# Patient Record
Sex: Female | Born: 1960
Health system: Southern US, Community
[De-identification: ages and names within clinical notes are randomized; demographics above are authoritative.]

## PROBLEM LIST (undated history)

## (undated) DIAGNOSIS — N939 Abnormal uterine and vaginal bleeding, unspecified: Secondary | ICD-10-CM

## (undated) DIAGNOSIS — R011 Cardiac murmur, unspecified: Secondary | ICD-10-CM

## (undated) DIAGNOSIS — E785 Hyperlipidemia, unspecified: Secondary | ICD-10-CM

## (undated) DIAGNOSIS — E079 Disorder of thyroid, unspecified: Secondary | ICD-10-CM

## (undated) DIAGNOSIS — I10 Essential (primary) hypertension: Secondary | ICD-10-CM

## (undated) HISTORY — DX: Hyperlipidemia, unspecified: E78.5

## (undated) HISTORY — PX: BREAST SURGERY: SHX581

## (undated) HISTORY — DX: Abnormal uterine and vaginal bleeding, unspecified: N93.9

## (undated) HISTORY — DX: Cardiac murmur, unspecified: R01.1

---

## 2003-02-09 ENCOUNTER — Inpatient Hospital Stay (HOSPITAL_COMMUNITY): Admission: AD | Admit: 2003-02-09 | Discharge: 2003-02-09 | Payer: Self-pay | Admitting: Obstetrics and Gynecology

## 2003-02-10 ENCOUNTER — Encounter: Payer: Self-pay | Admitting: Obstetrics and Gynecology

## 2003-02-11 ENCOUNTER — Encounter: Payer: Self-pay | Admitting: Obstetrics and Gynecology

## 2003-02-11 ENCOUNTER — Inpatient Hospital Stay (HOSPITAL_COMMUNITY): Admission: AD | Admit: 2003-02-11 | Discharge: 2003-02-11 | Payer: Self-pay | Admitting: Obstetrics and Gynecology

## 2003-02-18 ENCOUNTER — Inpatient Hospital Stay (HOSPITAL_COMMUNITY): Admission: AD | Admit: 2003-02-18 | Discharge: 2003-02-18 | Payer: Self-pay | Admitting: Obstetrics and Gynecology

## 2003-02-25 ENCOUNTER — Inpatient Hospital Stay (HOSPITAL_COMMUNITY): Admission: AD | Admit: 2003-02-25 | Discharge: 2003-02-25 | Payer: Self-pay | Admitting: Obstetrics and Gynecology

## 2003-03-03 ENCOUNTER — Encounter: Admission: RE | Admit: 2003-03-03 | Discharge: 2003-03-03 | Payer: Self-pay | Admitting: Obstetrics and Gynecology

## 2003-05-02 ENCOUNTER — Emergency Department (HOSPITAL_COMMUNITY): Admission: EM | Admit: 2003-05-02 | Discharge: 2003-05-03 | Payer: Self-pay | Admitting: Emergency Medicine

## 2003-05-08 ENCOUNTER — Emergency Department (HOSPITAL_COMMUNITY): Admission: EM | Admit: 2003-05-08 | Discharge: 2003-05-08 | Payer: Self-pay | Admitting: Emergency Medicine

## 2003-05-08 ENCOUNTER — Encounter: Payer: Self-pay | Admitting: Emergency Medicine

## 2004-08-19 ENCOUNTER — Emergency Department (HOSPITAL_COMMUNITY): Admission: EM | Admit: 2004-08-19 | Discharge: 2004-08-19 | Payer: Self-pay | Admitting: Emergency Medicine

## 2004-11-16 ENCOUNTER — Other Ambulatory Visit: Admission: RE | Admit: 2004-11-16 | Discharge: 2004-11-16 | Payer: Self-pay | Admitting: Obstetrics and Gynecology

## 2004-12-08 ENCOUNTER — Inpatient Hospital Stay (HOSPITAL_COMMUNITY): Admission: AD | Admit: 2004-12-08 | Discharge: 2004-12-08 | Payer: Self-pay | Admitting: Obstetrics and Gynecology

## 2005-01-02 ENCOUNTER — Ambulatory Visit (HOSPITAL_COMMUNITY): Admission: RE | Admit: 2005-01-02 | Discharge: 2005-01-02 | Payer: Self-pay | Admitting: Obstetrics and Gynecology

## 2005-09-27 ENCOUNTER — Emergency Department (HOSPITAL_COMMUNITY): Admission: EM | Admit: 2005-09-27 | Discharge: 2005-09-28 | Payer: Self-pay | Admitting: Emergency Medicine

## 2009-11-03 ENCOUNTER — Encounter: Admission: RE | Admit: 2009-11-03 | Discharge: 2009-11-03 | Payer: Self-pay | Admitting: Otolaryngology

## 2010-05-25 ENCOUNTER — Encounter: Admission: RE | Admit: 2010-05-25 | Discharge: 2010-05-25 | Payer: Self-pay | Admitting: Internal Medicine

## 2012-04-23 ENCOUNTER — Other Ambulatory Visit: Payer: Self-pay | Admitting: Physician Assistant

## 2012-05-09 ENCOUNTER — Other Ambulatory Visit: Payer: Self-pay | Admitting: *Deleted

## 2012-05-09 MED ORDER — LISINOPRIL-HYDROCHLOROTHIAZIDE 10-12.5 MG PO TABS: 1.0000 | ORAL_TABLET | Freq: Every day | ORAL | Status: DC

## 2012-08-16 ENCOUNTER — Encounter (HOSPITAL_COMMUNITY): Payer: Self-pay

## 2012-08-16 ENCOUNTER — Emergency Department (HOSPITAL_COMMUNITY)
Admission: EM | Admit: 2012-08-16 | Discharge: 2012-08-16 | Disposition: A | Discharge status: Home / Self Care | Payer: 59 | Attending: Emergency Medicine | Admitting: Emergency Medicine

## 2012-08-16 DIAGNOSIS — I10 Essential (primary) hypertension: Secondary | ICD-10-CM

## 2012-08-16 DIAGNOSIS — R51 Headache: Secondary | ICD-10-CM

## 2012-08-16 DIAGNOSIS — R110 Nausea: Secondary | ICD-10-CM | POA: Insufficient documentation

## 2012-08-16 DIAGNOSIS — E079 Disorder of thyroid, unspecified: Secondary | ICD-10-CM | POA: Insufficient documentation

## 2012-08-16 DIAGNOSIS — Z79899 Other long term (current) drug therapy: Secondary | ICD-10-CM | POA: Insufficient documentation

## 2012-08-16 HISTORY — DX: Disorder of thyroid, unspecified: E07.9

## 2012-08-16 HISTORY — DX: Essential (primary) hypertension: I10

## 2012-08-16 MED ORDER — ONDANSETRON 4 MG PO TBDP
4.0000 mg | ORAL_TABLET | Freq: Once | ORAL | Status: AC
  Administered 2012-08-16: 4 mg via ORAL
  Filled 2012-08-16: qty 1

## 2012-08-16 MED ORDER — DIPHENHYDRAMINE HCL 50 MG/ML IJ SOLN: 25.0000 mg | Freq: Once | INTRAMUSCULAR | Status: DC

## 2012-08-16 MED ORDER — DIPHENHYDRAMINE HCL 25 MG PO CAPS
ORAL_CAPSULE | ORAL | Status: AC
  Administered 2012-08-16: 25 mg
  Filled 2012-08-16: qty 1

## 2012-08-16 MED ORDER — OXYCODONE-ACETAMINOPHEN 5-325 MG PO TABS
2.0000 | ORAL_TABLET | Freq: Once | ORAL | Status: AC
  Administered 2012-08-16: 2 via ORAL
  Filled 2012-08-16: qty 2

## 2012-08-16 MED ORDER — DIPHENHYDRAMINE HCL 25 MG PO CAPS
25.0000 mg | ORAL_CAPSULE | Freq: Once | ORAL | Status: AC
  Administered 2012-08-16: 25 mg via ORAL

## 2012-08-16 NOTE — ED Provider Notes (Signed)
History     CSN: [id number]  Arrival date & time 08/16/12  1844   First MD Initiated Contact with Patient 08/16/12 2045      Chief Complaint  Patient presents with  . Headache  . Hypertension  . Nausea    (Consider location/radiation/quality/duration/timing/severity/associated sxs/prior treatment) HPI Comments: Stacey Mason 51 y.o. female   The chief complaint is: Patient presents with:   Headache   Hypertension   Nausea    51 y/o female presents with cc of HA and HTN.c/o throbbing HA BL parietotemporal.  8/10. Gradually worsening throughout day. Pressure like . No hx migraines.  No treatments tried.  BP 180/ 110 at home.  Denies light headedness, weakness, visual disturbances, scotomata. Denies dental pain, sinus pressure or upper respiratory infection symptoms. Denies unilateral weakness, facial asymmetry, difficulty with speech, change in gait, or vertigo. Patient has not taken any meds for treatment.      The history is provided by the patient and a relative. No language interpreter was used.    Past Medical History  Diagnosis Date  . Hypertension   . Thyroid disease     Past Surgical History  Procedure Date  . Breast surgery     No family history on file.  History  Substance Use Topics  . Smoking status: Never Smoker   . Smokeless tobacco: Not on file  . Alcohol Use: No    OB History    Grav Para Term Preterm Abortions TAB SAB Ect Mult Living                  Review of Systems  Constitutional: Negative.   HENT: Negative for congestion, trouble swallowing, neck pain, neck stiffness, dental problem, voice change and sinus pressure.   Eyes: Negative.   Respiratory: Negative.   Cardiovascular: Negative.   Gastrointestinal: Negative.   Genitourinary: Negative.   Musculoskeletal: Negative.   Skin: Negative.   Neurological: Positive for headaches. Negative for dizziness, tremors, seizures, syncope, facial asymmetry, speech difficulty,  weakness, light-headedness and numbness.  Psychiatric/Behavioral: Negative.     Allergies  Review of patient's allergies indicates no known allergies.  Home Medications   Current Outpatient Rx  Name  Route  Sig  Dispense  Refill  . LEVOTHYROXINE SODIUM 75 MCG PO TABS   Oral   Take 75 mcg by mouth daily.         Marland Kitchen LISINOPRIL-HYDROCHLOROTHIAZIDE 10-12.5 MG PO TABS   Oral   Take 1 tablet by mouth daily.   90 tablet   0     NEEDS OFFICE VISIT     BP 150/87  Pulse 82  Temp 98.6 F (37 C) (Oral)  Resp 15  Ht 5\' 4"  (1.626 m)  Wt 183 lb (83.008 kg)  BMI 31.41 kg/m2  SpO2 99%  Physical Exam  Nursing note and vitals reviewed. Constitutional: She is oriented to person, place, and time. She appears well-developed and well-nourished. No distress.  HENT:  Head: Normocephalic and atraumatic.  Eyes: Conjunctivae normal and EOM are normal. Pupils are equal, round, and reactive to light. No scleral icterus.  Neck: Normal range of motion.  Cardiovascular: Normal rate, regular rhythm and normal heart sounds.  Exam reveals no gallop and no friction rub.   No murmur heard. Pulmonary/Chest: Effort normal and breath sounds normal. No respiratory distress.  Abdominal: Soft. Bowel sounds are normal. She exhibits no distension and no mass. There is no tenderness. There is no guarding.  Neurological: She is alert and oriented  to person, place, and time. She has normal reflexes. No cranial nerve deficit. Coordination normal.  Skin: Skin is warm and dry. She is not diaphoretic.  Psychiatric: She has a normal mood and affect. Her behavior is normal. Judgment and thought content normal.    ED Course  Procedures (including critical care time)  Labs Reviewed - No data to display No results found.   1. Headache   2. HTN (hypertension)       MDM  BP 132/74  Pulse 75  Temp 97.9 F (36.6 C) (Oral)  Resp 15  Ht 5\' 4"  (1.626 m)  Wt 183 lb (83.008 kg)  BMI 31.41 kg/m2  SpO2 96% HTN  resolved.  No focal neuro deficits. No meningismus or rash.  Headache is fully resolved after meds. Discussed reasons to seek immediate care. Patient expresses understanding and agrees with plan.        Arthor Captain, PA-C 08/18/12 [phone]

## 2012-08-16 NOTE — ED Notes (Signed)
Pt presents with NAD- c/o of HA that started this am- no neuro deficits GCS 15 PEERL-

## 2012-08-16 NOTE — ED Notes (Signed)
Pt reports a headache that started this AM.  She rates the pain as a 10 and the pain is on the top of her head and frontal area.  Pt has not taken any pain medication for the headache.  Also c/o right upper quadrant pain.  Also c/o nausea but no vomiting.

## 2012-08-18 NOTE — ED Provider Notes (Signed)
Medical screening examination/treatment/procedure(s) were performed by non-physician practitioner and as supervising physician I was immediately available for consultation/collaboration.  Flint Melter, MD 08/18/12 1537

## 2012-10-27 ENCOUNTER — Ambulatory Visit (INDEPENDENT_AMBULATORY_CARE_PROVIDER_SITE_OTHER): Payer: 59 | Admitting: Family Medicine

## 2012-10-27 VITALS — BP 122/88 | HR 110 | Temp 98.2°F | Resp 20 | Ht 68.0 in | Wt 191.0 lb

## 2012-10-27 DIAGNOSIS — R05 Cough: Secondary | ICD-10-CM

## 2012-10-27 DIAGNOSIS — J40 Bronchitis, not specified as acute or chronic: Secondary | ICD-10-CM

## 2012-10-27 DIAGNOSIS — R059 Cough, unspecified: Secondary | ICD-10-CM

## 2012-10-27 LAB — POCT INFLUENZA A/B
Influenza A, POC: NEGATIVE
Influenza B, POC: NEGATIVE

## 2012-10-27 MED ORDER — AZITHROMYCIN 250 MG PO TABS: ORAL_TABLET | ORAL | Status: DC

## 2012-10-27 MED ORDER — HYDROCODONE-HOMATROPINE 5-1.5 MG/5ML PO SYRP: 5.0000 mL | ORAL_SOLUTION | ORAL | Status: DC | PRN

## 2012-10-27 NOTE — Progress Notes (Signed)
Subjective: Patient has been ill for about 5 days. She has some sore throat initially. She does continue to feel bad with fever chills cough and body ache. She generally is a healthy person and usually has a good immune system.Some purulence.  Objective: Overweight [place] lady. TMs normal. Throat minimally erythematous. Neck supple without significant nodes. Chest clear but has a constant cough. Heart regular without murmurs.  Assessment: Probable influenza or some other viral syndrome  Plan: Check flu swab  Results for orders placed in visit on 10/27/12  POCT INFLUENZA A/B      Component Value Range   Influenza A, POC Negative     Influenza B, POC Negative      Bronchitis

## 2012-10-27 NOTE — Patient Instructions (Addendum)
Fluids  Get lots of rest  Take medications as ordered  Return if worse

## 2012-11-30 ENCOUNTER — Other Ambulatory Visit: Payer: Self-pay | Admitting: *Deleted

## 2012-11-30 MED ORDER — LISINOPRIL-HYDROCHLOROTHIAZIDE 10-12.5 MG PO TABS: 1.0000 | ORAL_TABLET | Freq: Every day | ORAL | Status: DC

## 2013-02-24 ENCOUNTER — Other Ambulatory Visit: Payer: Self-pay | Admitting: Physician Assistant

## 2013-02-24 NOTE — Telephone Encounter (Signed)
Cannot fill for even whole month. Pt overdue for OV/labs

## 2013-02-26 ENCOUNTER — Encounter: Payer: Self-pay | Admitting: Physician Assistant

## 2013-02-26 ENCOUNTER — Ambulatory Visit (INDEPENDENT_AMBULATORY_CARE_PROVIDER_SITE_OTHER): Payer: 59 | Admitting: Physician Assistant

## 2013-02-26 ENCOUNTER — Telehealth: Payer: Self-pay

## 2013-02-26 VITALS — BP 128/72 | HR 74 | Temp 98.6°F | Resp 16 | Ht 65.5 in | Wt 193.0 lb

## 2013-02-26 DIAGNOSIS — Z1239 Encounter for other screening for malignant neoplasm of breast: Secondary | ICD-10-CM

## 2013-02-26 DIAGNOSIS — L259 Unspecified contact dermatitis, unspecified cause: Secondary | ICD-10-CM

## 2013-02-26 DIAGNOSIS — Z1231 Encounter for screening mammogram for malignant neoplasm of breast: Secondary | ICD-10-CM

## 2013-02-26 DIAGNOSIS — E785 Hyperlipidemia, unspecified: Secondary | ICD-10-CM | POA: Insufficient documentation

## 2013-02-26 DIAGNOSIS — R7309 Other abnormal glucose: Secondary | ICD-10-CM

## 2013-02-26 DIAGNOSIS — E039 Hypothyroidism, unspecified: Secondary | ICD-10-CM | POA: Insufficient documentation

## 2013-02-26 DIAGNOSIS — Z1211 Encounter for screening for malignant neoplasm of colon: Secondary | ICD-10-CM

## 2013-02-26 DIAGNOSIS — L309 Dermatitis, unspecified: Secondary | ICD-10-CM

## 2013-02-26 DIAGNOSIS — I10 Essential (primary) hypertension: Secondary | ICD-10-CM

## 2013-02-26 DIAGNOSIS — R739 Hyperglycemia, unspecified: Secondary | ICD-10-CM | POA: Insufficient documentation

## 2013-02-26 DIAGNOSIS — Z23 Encounter for immunization: Secondary | ICD-10-CM

## 2013-02-26 LAB — POCT CBC
Granulocyte percent: 44.7 %G (ref 37–80)
HCT, POC: 39.6 % (ref 37.7–47.9)
Hemoglobin: 12.3 g/dL (ref 12.2–16.2)
Lymph, poc: 2.8 (ref 0.6–3.4)
MCH, POC: 27.5 pg (ref 27–31.2)
MCHC: 31.1 g/dL — AB (ref 31.8–35.4)
MCV: 88.6 fL (ref 80–97)
MID (cbc): 0.5 (ref 0–0.9)
MPV: 8.3 fL (ref 0–99.8)
POC Granulocyte: 2.7 (ref 2–6.9)
POC LYMPH PERCENT: 47.3 %L (ref 10–50)
POC MID %: 8 %M (ref 0–12)
Platelet Count, POC: 406 10*3/uL (ref 142–424)
RBC: 4.47 M/uL (ref 4.04–5.48)
RDW, POC: 14.5 %
WBC: 6 10*3/uL (ref 4.6–10.2)

## 2013-02-26 LAB — COMPREHENSIVE METABOLIC PANEL
ALT: 13 U/L (ref 0–35)
AST: 14 U/L (ref 0–37)
Albumin: 4 g/dL (ref 3.5–5.2)
Alkaline Phosphatase: 92 U/L (ref 39–117)
BUN: 12 mg/dL (ref 6–23)
CO2: 25 mEq/L (ref 19–32)
Calcium: 9.4 mg/dL (ref 8.4–10.5)
Chloride: 104 mEq/L (ref 96–112)
Creat: 0.65 mg/dL (ref 0.50–1.10)
Glucose, Bld: 124 mg/dL — ABNORMAL HIGH (ref 70–99)
Potassium: 3.9 mEq/L (ref 3.5–5.3)
Sodium: 138 mEq/L (ref 135–145)
Total Bilirubin: 0.2 mg/dL — ABNORMAL LOW (ref 0.3–1.2)
Total Protein: 6.6 g/dL (ref 6.0–8.3)

## 2013-02-26 LAB — GLUCOSE, POCT (MANUAL RESULT ENTRY): POC Glucose: 108 mg/dl — AB (ref 70–99)

## 2013-02-26 LAB — LIPID PANEL
Cholesterol: 209 mg/dL — ABNORMAL HIGH (ref 0–200)
HDL: 41 mg/dL (ref >39)
LDL Cholesterol: 140 mg/dL — ABNORMAL HIGH (ref 0–99)
Total CHOL/HDL Ratio: 5.1 Ratio
Triglycerides: 141 mg/dL (ref <150)
VLDL: 28 mg/dL (ref 0–40)

## 2013-02-26 LAB — POCT GLYCOSYLATED HEMOGLOBIN (HGB A1C): Hemoglobin A1C: 5.5

## 2013-02-26 LAB — TSH: TSH: 6.691 u[IU]/mL — ABNORMAL HIGH (ref 0.350–4.500)

## 2013-02-26 MED ORDER — LISINOPRIL-HYDROCHLOROTHIAZIDE 10-12.5 MG PO TABS: 1.0000 | ORAL_TABLET | Freq: Every day | ORAL | Status: DC

## 2013-02-26 MED ORDER — CLOBETASOL PROPIONATE 0.05 % EX CREA: TOPICAL_CREAM | Freq: Two times a day (BID) | CUTANEOUS | Status: DC

## 2013-02-26 MED ORDER — LEVOTHYROXINE SODIUM 75 MCG PO TABS: 75.0000 ug | ORAL_TABLET | Freq: Every day | ORAL | Status: DC

## 2013-02-26 NOTE — Patient Instructions (Addendum)
I will contact you with your lab results as soon as they are available.   If you have not heard from me in 2 weeks, please contact me.  The fastest way to get your results is to register for My Chart (see the instructions on the last page of this printout).  For the rash: Drink at least 64 ounces of water daily. Consider a humidifier for the room where you sleep. Bathe once daily. Avoid using HOT water, as it dries skin. Avoid deodorant soaps (Dial is the worst!) and stick with gentle cleansers (I like Cetaphil Liquid Cleanser). After bathing, dry off completely, then apply a thick emollient cream (I like Cetaphil Moisturizing Cream). Apply the cream twice daily, or more!

## 2013-02-26 NOTE — Progress Notes (Signed)
Subjective:    Patient ID: Stacey Mason, female    DOB: 05/21/1961, 52 y.o.   MRN: [id number]  HPI This 52 y.o. female presents for evaluation of hypertension and hyperlipidemia.  I have not seen her in the past 2 years. Back pain is intermittent and worse with repetitive bending over.  Staying active helps it.  Intermittent itchy rash on the lower legs and RIGHT forearm.  Cream prescribed by the dermatologist last year helps, but she doesn't like the darker coloration that is residual. Has recently seen an eye specialist for routine exam.  Past medical history, surgical history, family history, social history and problem list reviewed.  Review of Systems No chest pain, SOB, HA, dizziness, vision change, N/V, diarrhea, constipation, dysuria, urinary urgency or frequency.     Objective:   Physical Exam Blood pressure 128/72, pulse 74, temperature 98.6 F (37 C), temperature source Oral, resp. rate 16, height 5' 5.5" (1.664 m), weight 193 lb (87.544 kg). Body mass index is 31.62 kg/(m^2). Well-developed, well nourished [place] female who is awake, alert and oriented, in NAD. HEENT: [place]/AT, PERRL, EOMI.  Sclera and conjunctiva are clear.  EAC are patent, TMs are normal in appearance. Nasal mucosa is pink and moist. OP is clear. Neck: supple, non-tender, no lymphadenopathy, thyromegaly. Heart: RRR, no murmur Lungs: normal effort, CTA Extremities: no cyanosis, clubbing or edema. Skin: warm and dry without rash. Psychologic: good mood and appropriate affect, normal speech and behavior.    Results for orders placed in visit on 02/26/13  POCT CBC      Result Value Range   WBC 6.0  4.6 - 10.2 K/uL   Lymph, poc 2.8  0.6 - 3.4   POC LYMPH PERCENT 47.3  10 - 50 %L   MID (cbc) 0.5  0 - 0.9   POC MID % 8.0  0 - 12 %M   POC Granulocyte 2.7  2 - 6.9   Granulocyte percent 44.7  37 - 80 %G   RBC 4.47  4.04 - 5.48 M/uL   Hemoglobin 12.3  12.2 - 16.2 g/dL   HCT, POC 10.2  72.5 - 47.9 %   MCV  88.6  80 - 97 fL   MCH, POC 27.5  27 - 31.2 pg   MCHC 31.1 (*) 31.8 - 35.4 g/dL   RDW, POC 36.6     Platelet Count, POC 406  142 - 424 K/uL   MPV 8.3  0 - 99.8 fL  GLUCOSE, POCT (MANUAL RESULT ENTRY)      Result Value Range   POC Glucose 108 (*) 70 - 99 mg/dl  POCT GLYCOSYLATED HEMOGLOBIN (HGB A1C)      Result Value Range   Hemoglobin A1C 5.5         Assessment & Plan:  HTN (hypertension) - Plan: Comprehensive metabolic panel, POCT CBC, lisinopril-hydrochlorothiazide (PRINZIDE,ZESTORETIC) 10-12.5 MG per tablet  Hypothyroidism - Plan: TSH, levothyroxine (SYNTHROID, LEVOTHROID) 75 MCG tablet  Hyperglycemia - Plan: POCT glucose (manual entry), POCT glycosylated hemoglobin (Hb A1C)  Hyperlipidemia - Plan: Lipid panel; previously on pravastatin.  Dermatitis - Plan: clobetasol cream (TEMOVATE) 0.05 %; dry skin care information provided.  Need for Tdap vaccination - Plan: Tdap vaccine greater than or equal to 7yo IM  Screening for breast cancer - Plan: MM Digital Screening  Screening for colon cancer - Plan: Ambulatory referral to Gastroenterology   Schedule a CPE at her convenience.  Other follow-up in 6 months or as indicated by pending lab results.  Fernande Bras, PA-C Physician Assistant-Certified [facility]

## 2013-02-26 NOTE — Telephone Encounter (Signed)
Patient is at pharmacy now and says her itch cream is not there to pick up please call her [phone]

## 2013-02-26 NOTE — Progress Notes (Signed)
Patient has a CPE scheduled with Porfirio Oar on June 26 @ 3:15pm.

## 2013-03-01 NOTE — Progress Notes (Signed)
Sent pt reminder letter with June CPE appt info.

## 2013-03-02 ENCOUNTER — Encounter: Payer: Self-pay | Admitting: Gastroenterology

## 2013-04-01 ENCOUNTER — Telehealth: Payer: Self-pay

## 2013-04-01 DIAGNOSIS — L309 Dermatitis, unspecified: Secondary | ICD-10-CM

## 2013-04-01 MED ORDER — CLOBETASOL PROPIONATE 0.05 % EX CREA: TOPICAL_CREAM | Freq: Two times a day (BID) | CUTANEOUS | Status: DC

## 2013-04-01 NOTE — Telephone Encounter (Signed)
Sent in

## 2013-04-01 NOTE — Telephone Encounter (Signed)
Patient is requesting refill on clobetasol cream. Patient states she has already called her pharmacy. [phone].

## 2013-04-01 NOTE — Telephone Encounter (Signed)
Pended please advise.  

## 2013-04-02 ENCOUNTER — Ambulatory Visit: Payer: 59

## 2013-04-02 NOTE — Telephone Encounter (Signed)
Spoke with pt advised Rx sent in.

## 2013-04-05 ENCOUNTER — Ambulatory Visit: Payer: 59

## 2013-04-08 ENCOUNTER — Encounter: Payer: 59 | Admitting: Physician Assistant

## 2013-04-20 ENCOUNTER — Encounter: Payer: 59 | Admitting: Gastroenterology

## 2013-05-20 ENCOUNTER — Encounter: Payer: 59 | Admitting: Physician Assistant

## 2013-05-30 ENCOUNTER — Telehealth: Payer: Self-pay

## 2013-05-30 NOTE — Telephone Encounter (Signed)
Patient wanted to ask Dr. Clelia Croft if it was safe for her to start taking Biotin- a hair pill.   [phone]

## 2013-05-31 NOTE — Telephone Encounter (Signed)
I think she meant Stacey Mason. But yes, it should be safe for her to take.

## 2013-06-01 NOTE — Telephone Encounter (Signed)
LMOM it is ok to take Biotin.

## 2013-06-30 ENCOUNTER — Other Ambulatory Visit: Payer: Self-pay | Admitting: Physician Assistant

## 2013-10-30 ENCOUNTER — Other Ambulatory Visit: Payer: Self-pay | Admitting: Physician Assistant

## 2014-03-28 ENCOUNTER — Other Ambulatory Visit: Payer: Self-pay | Admitting: Family Medicine

## 2014-03-28 ENCOUNTER — Ambulatory Visit (INDEPENDENT_AMBULATORY_CARE_PROVIDER_SITE_OTHER): Payer: 59 | Admitting: Family Medicine

## 2014-03-28 VITALS — BP 122/84 | HR 96 | Temp 98.2°F | Resp 16 | Ht 64.5 in | Wt 192.8 lb

## 2014-03-28 DIAGNOSIS — M5430 Sciatica, unspecified side: Secondary | ICD-10-CM

## 2014-03-28 DIAGNOSIS — M5431 Sciatica, right side: Secondary | ICD-10-CM

## 2014-03-28 MED ORDER — MELOXICAM 15 MG PO TABS: 15.0000 mg | ORAL_TABLET | Freq: Every day | ORAL | Status: DC

## 2014-03-28 MED ORDER — CYCLOBENZAPRINE HCL 10 MG PO TABS: 10.0000 mg | ORAL_TABLET | Freq: Three times a day (TID) | ORAL | Status: DC | PRN

## 2014-03-28 MED ORDER — PREDNISONE 20 MG PO TABS: ORAL_TABLET | ORAL | Status: DC

## 2014-03-28 NOTE — Progress Notes (Signed)
Subjective:    Patient ID: Stacey Mason, female    DOB: April 22, 1961, 53 y.o.   MRN: [id number] This chart was scribed for [name], MD by Randa Evens, ED Scribe. This Patient was seen in room 08 and the patients care was started at 9:15 PM  HPI Chief Complaint  Patient presents with  . Back Pain    back pain x 1 month ago that radiates down into her right leg.    HPI Comments: Stacey Mason is a 53 y.o. female who presents to the [facility] complaining of sharp back pain on her right central  lower back. She states that the pain radiates down her right leg all the way down to her toes. She states movement worsens the pain. She states the pain feels like somebody has shot her. She states she has taken ibuprofen with no relief. States she had Biofreeze with some improvement. She denies numbness, weakness, or any other related symptoms.   She is also complaining of right elbow pain. She states she hit it on the bed   Past Medical History  Diagnosis Date  . Hypertension   . Thyroid disease   . Hyperlipidemia   . Cardiac murmur    Prior to Admission medications   Medication Sig Start Date End Date Taking? Authorizing Provider  clobetasol cream (TEMOVATE) 0.05 % APPLY TOPICALLY TWICE DAILY 06/30/13  Yes Ryan M Dunn, PA-C  levothyroxine (SYNTHROID, LEVOTHROID) 75 MCG tablet Take 1 tablet (75 mcg total) by mouth daily. 02/26/13  Yes Chelle S Jeffery, PA-C  lisinopril-hydrochlorothiazide (PRINZIDE,ZESTORETIC) 10-12.5 MG per tablet Take 1 tablet by mouth daily. 02/26/13  Yes Chelle S Jeffery, PA-C   No Known Allergies    Review of Systems  Musculoskeletal: Positive for arthralgias and back pain.       Objective:   BP 122/84  Pulse 96  Temp(Src) 98.2 F (36.8 C) (Oral)  Resp 16  Ht 5' 4.5" (1.638 m)  Wt 192 lb 12.8 oz (87.454 kg)  BMI 32.60 kg/m2  SpO2 99%    Physical Exam  Nursing note and vitals reviewed. Constitutional: She is oriented to person,  place, and time. She appears well-developed and well-nourished. No distress.  HENT:  Head: Normocephalic and atraumatic.  Eyes: Conjunctivae and EOM are normal.  Neck: Neck supple.  Pulmonary/Chest: Effort normal. No respiratory distress.  Musculoskeletal: Normal range of motion.  Neurological: She is alert and oriented to person, place, and time.  Reflex Scores:      Patellar reflexes are 2+ on the right side and 2+ on the left side. Positive right straight leg raise.  Negative left straight leg raise. Achilles unable to elicited bilaterally   Skin: Skin is warm and dry.  Psychiatric: She has a normal mood and affect. Her behavior is normal.          Assessment & Plan:   Sciatica of right side  Meds ordered this encounter  Medications  . predniSONE (DELTASONE) 20 MG tablet    Sig: 3 x 2d, 2 x 2d, 1 x 2d    Dispense:  12 tablet    Refill:  0  . meloxicam (MOBIC) 15 MG tablet    Sig: Take 1 tablet (15 mg total) by mouth daily.    Dispense:  30 tablet    Refill:  0  . cyclobenzaprine (FLEXERIL) 10 MG tablet    Sig: Take 1 tablet (10 mg total) by mouth 3 (three) times daily as needed  for muscle spasms.    Dispense:  30 tablet    Refill:  0    I personally performed the services described in this documentation, which was scribed in my presence. The recorded information has been reviewed and considered, and addended by me as needed.  [name], MD MPH

## 2014-03-28 NOTE — Patient Instructions (Addendum)
Start the meloxicam AFTER the course of prednisone is complete is 6 days. Take the meloxicam every morning. Do not use with any other otc pain medication other than tylenol/acetaminophen - so no aleve, ibuprofen, motrin, advil, etc. I recommend keeping meloxicam on board every morning. Then when you come home, take a muscle relaxant, cyclobenzaprine (it will make you very sleepy) followed by 15 minutes of heat followed by gentle stretching. Try to do this regiment 2 - 3 times a day.  If you are still having pain in 4 to 6 wks, come back to clinic for further eval. RTC immed if symptoms worsen or you develop any other concerning symptoms below such has weakness, numbness, changes in your bowels or bladder, or worsening pain.  Sciatica Sciatica is pain, weakness, numbness, or tingling along the path of the sciatic nerve. The nerve starts in the lower back and runs down the back of each leg. The nerve controls the muscles in the lower leg and in the back of the knee, while also providing sensation to the back of the thigh, lower leg, and the sole of your foot. Sciatica is a symptom of another medical condition. For instance, nerve damage or certain conditions, such as a herniated disk or bone spur on the spine, pinch or put pressure on the sciatic nerve. This causes the pain, weakness, or other sensations normally associated with sciatica. Generally, sciatica only affects one side of the body. CAUSES   Herniated or slipped disc.  Degenerative disk disease.  A pain disorder involving the narrow muscle in the buttocks (piriformis syndrome).  Pelvic injury or fracture.  Pregnancy.  Tumor (rare). SYMPTOMS  Symptoms can vary from mild to very severe. The symptoms usually travel from the low back to the buttocks and down the back of the leg. Symptoms can include:  Mild tingling or dull aches in the lower back, leg, or hip.  Numbness in the back of the calf or sole of the foot.  Burning sensations in  the lower back, leg, or hip.  Sharp pains in the lower back, leg, or hip.  Leg weakness.  Severe back pain inhibiting movement. These symptoms may get worse with coughing, sneezing, laughing, or prolonged sitting or standing. Also, being overweight may worsen symptoms. DIAGNOSIS  Your caregiver will perform a physical exam to look for common symptoms of sciatica. He or she may ask you to do certain movements or activities that would trigger sciatic nerve pain. Other tests may be performed to find the cause of the sciatica. These may include:  Blood tests.  X-rays.  Imaging tests, such as an MRI or CT scan. TREATMENT  Treatment is directed at the cause of the sciatic pain. Sometimes, treatment is not necessary and the pain and discomfort goes away on its own. If treatment is needed, your caregiver may suggest:  Over-the-counter medicines to relieve pain.  Prescription medicines, such as anti-inflammatory medicine, muscle relaxants, or narcotics.  Applying heat or ice to the painful area.  Steroid injections to lessen pain, irritation, and inflammation around the nerve.  Reducing activity during periods of pain.  Exercising and stretching to strengthen your abdomen and improve flexibility of your spine. Your caregiver may suggest losing weight if the extra weight makes the back pain worse.  Physical therapy.  Surgery to eliminate what is pressing or pinching the nerve, such as a bone spur or part of a herniated disk. HOME CARE INSTRUCTIONS   Only take over-the-counter or prescription medicines for pain  or discomfort as directed by your caregiver.  Apply ice to the affected area for 20 minutes, 3 4 times a day for the first 48 72 hours. Then try heat in the same way.  Exercise, stretch, or perform your usual activities if these do not aggravate your pain.  Attend physical therapy sessions as directed by your caregiver.  Keep all follow-up appointments as directed by your  caregiver.  Do not wear high heels or shoes that do not provide proper support.  Check your mattress to see if it is too soft. A firm mattress may lessen your pain and discomfort. SEEK IMMEDIATE MEDICAL CARE IF:   You lose control of your bowel or bladder (incontinence).  You have increasing weakness in the lower back, pelvis, buttocks, or legs.  You have redness or swelling of your back.  You have a burning sensation when you urinate.  You have pain that gets worse when you lie down or awakens you at night.  Your pain is worse than you have experienced in the past.  Your pain is lasting longer than 4 weeks.  You are suddenly losing weight without reason. MAKE SURE YOU:  Understand these instructions.  Will watch your condition.  Will get help right away if you are not doing well or get worse. Document Released: 09/24/2001 Document Revised: 03/31/2012 Document Reviewed: 02/09/2012 [facility] Patient Information 2014 [employer].    Sciatica with Rehab The sciatic nerve runs from the back down the leg and is responsible for sensation and control of the muscles in the back (posterior) side of the thigh, lower leg, and foot. Sciatica is a condition that is characterized by inflammation of this nerve.  SYMPTOMS   Signs of nerve damage, including numbness and/or weakness along the posterior side of the lower extremity.  Pain in the back of the thigh that may also travel down the leg.  Pain that worsens when sitting for long periods of time.  Occasionally, pain in the back or buttock. CAUSES  Inflammation of the sciatic nerve is the cause of sciatica. The inflammation is due to something irritating the nerve. Common sources of irritation include:  Sitting for long periods of time.  Direct trauma to the nerve.  Arthritis of the spine.  Herniated or ruptured disk.  Slipping of the vertebrae (spondylolithesis)  Pressure from soft tissues, such as muscles or  ligament-like tissue (fascia). RISK INCREASES WITH:  Sports that place pressure or stress on the spine (football or weightlifting).  Poor strength and flexibility.  Failure to warm-up properly before activity.  Family history of low back pain or disk disorders.  Previous back injury or surgery.  Poor body mechanics, especially when lifting, or poor posture. PREVENTION   Warm up and stretch properly before activity.  Maintain physical fitness:  Strength, flexibility, and endurance.  Cardiovascular fitness.  Learn and use proper technique, especially with posture and lifting. When possible, have coach correct improper technique.  Avoid activities that place stress on the spine. PROGNOSIS If treated properly, then sciatica usually resolves within 6 weeks. However, occasionally surgery is necessary.  RELATED COMPLICATIONS   Permanent nerve damage, including pain, numbness, tingle, or weakness.  Chronic back pain.  Risks of surgery: infection, bleeding, nerve damage, or damage to surrounding tissues. TREATMENT Treatment initially involves resting from any activities that aggravate your symptoms. The use of ice and medication may help reduce pain and inflammation. The use of strengthening and stretching exercises may help reduce pain with activity. These exercises  may be performed at home or with referral to a therapist. A therapist may recommend further treatments, such as transcutaneous electronic nerve stimulation (TENS) or ultrasound. Your caregiver may recommend corticosteroid injections to help reduce inflammation of the sciatic nerve. If symptoms persist despite non-surgical (conservative) treatment, then surgery may be recommended. MEDICATION  If pain medication is necessary, then nonsteroidal anti-inflammatory medications, such as aspirin and ibuprofen, or other minor pain relievers, such as acetaminophen, are often recommended.  Do not take pain medication for 7 days  before surgery.  Prescription pain relievers may be given if deemed necessary by your caregiver. Use only as directed and only as much as you need.  Ointments applied to the skin may be helpful.  Corticosteroid injections may be given by your caregiver. These injections should be reserved for the most serious cases, because they may only be given a certain number of times. HEAT AND COLD  Cold treatment (icing) relieves pain and reduces inflammation. Cold treatment should be applied for 10 to 15 minutes every 2 to 3 hours for inflammation and pain and immediately after any activity that aggravates your symptoms. Use ice packs or massage the area with a piece of ice (ice massage).  Heat treatment may be used prior to performing the stretching and strengthening activities prescribed by your caregiver, physical therapist, or athletic trainer. Use a heat pack or soak the injury in warm water. SEEK MEDICAL CARE IF:  Treatment seems to offer no benefit, or the condition worsens.  Any medications produce adverse side effects. EXERCISES  RANGE OF MOTION (ROM) AND STRETCHING EXERCISES - Sciatica Most people with sciatic will find that their symptoms worsen with either excessive bending forward (flexion) or arching at the low back (extension). The exercises which will help resolve your symptoms will focus on the opposite motion. Your physician, physical therapist or athletic trainer will help you determine which exercises will be most helpful to resolve your low back pain. Do not complete any exercises without first consulting with your clinician. Discontinue any exercises which worsen your symptoms until you speak to your clinician. If you have pain, numbness or tingling which travels down into your buttocks, leg or foot, the goal of the therapy is for these symptoms to move closer to your back and eventually resolve. Occasionally, these leg symptoms will get better, but your low back pain may worsen; this  is typically an indication of progress in your rehabilitation. Be certain to be very alert to any changes in your symptoms and the activities in which you participated in the 24 hours prior to the change. Sharing this information with your clinician will allow him/her to most efficiently treat your condition. These exercises may help you when beginning to rehabilitate your injury. Your symptoms may resolve with or without further involvement from your physician, physical therapist or athletic trainer. While completing these exercises, remember:   Restoring tissue flexibility helps normal motion to return to the joints. This allows healthier, less painful movement and activity.  An effective stretch should be held for at least 30 seconds.  A stretch should never be painful. You should only feel a gentle lengthening or release in the stretched tissue. FLEXION RANGE OF MOTION AND STRETCHING EXERCISES: STRETCH  Flexion, Single Knee to Chest   Lie on a firm bed or floor with both legs extended in front of you.  Keeping one leg in contact with the floor, bring your opposite knee to your chest. Hold your leg in place by either  grabbing behind your thigh or at your knee.  Pull until you feel a gentle stretch in your low back. Hold __________ seconds.  Slowly release your grasp and repeat the exercise with the opposite side. Repeat __________ times. Complete this exercise __________ times per day.  STRETCH  Flexion, Double Knee to Chest  Lie on a firm bed or floor with both legs extended in front of you.  Keeping one leg in contact with the floor, bring your opposite knee to your chest.  Tense your stomach muscles to support your back and then lift your other knee to your chest. Hold your legs in place by either grabbing behind your thighs or at your knees.  Pull both knees toward your chest until you feel a gentle stretch in your low back. Hold __________ seconds.  Tense your stomach muscles and  slowly return one leg at a time to the floor. Repeat __________ times. Complete this exercise __________ times per day.  STRETCH  Low Trunk Rotation   Lie on a firm bed or floor. Keeping your legs in front of you, bend your knees so they are both pointed toward the ceiling and your feet are flat on the floor.  Extend your arms out to the side. This will stabilize your upper body by keeping your shoulders in contact with the floor.  Gently and slowly drop both knees together to one side until you feel a gentle stretch in your low back. Hold for __________ seconds.  Tense your stomach muscles to support your low back as you bring your knees back to the starting position. Repeat the exercise to the other side. Repeat __________ times. Complete this exercise __________ times per day  EXTENSION RANGE OF MOTION AND FLEXIBILITY EXERCISES: STRETCH  Extension, Prone on Elbows  Lie on your stomach on the floor, a bed will be too soft. Place your palms about shoulder width apart and at the height of your head.  Place your elbows under your shoulders. If this is too painful, stack pillows under your chest.  Allow your body to relax so that your hips drop lower and make contact more completely with the floor.  Hold this position for __________ seconds.  Slowly return to lying flat on the floor. Repeat __________ times. Complete this exercise __________ times per day.  RANGE OF MOTION  Extension, Prone Press Ups  Lie on your stomach on the floor, a bed will be too soft. Place your palms about shoulder width apart and at the height of your head.  Keeping your back as relaxed as possible, slowly straighten your elbows while keeping your hips on the floor. You may adjust the placement of your hands to maximize your comfort. As you gain motion, your hands will come more underneath your shoulders.  Hold this position __________ seconds.  Slowly return to lying flat on the floor. Repeat __________  times. Complete this exercise __________ times per day.  STRENGTHENING EXERCISES - Sciatica  These exercises may help you when beginning to rehabilitate your injury. These exercises should be done near your "sweet spot." This is the neutral, low-back arch, somewhere between fully rounded and fully arched, that is your least painful position. When performed in this safe range of motion, these exercises can be used for people who have either a flexion or extension based injury. These exercises may resolve your symptoms with or without further involvement from your physician, physical therapist or athletic trainer. While completing these exercises, remember:   Muscles can  gain both the endurance and the strength needed for everyday activities through controlled exercises.  Complete these exercises as instructed by your physician, physical therapist or athletic trainer. Progress with the resistance and repetition exercises only as your caregiver advises.  You may experience muscle soreness or fatigue, but the pain or discomfort you are trying to eliminate should never worsen during these exercises. If this pain does worsen, stop and make certain you are following the directions exactly. If the pain is still present after adjustments, discontinue the exercise until you can discuss the trouble with your clinician. STRENGTHENING Deep Abdominals, Pelvic Tilt   Lie on a firm bed or floor. Keeping your legs in front of you, bend your knees so they are both pointed toward the ceiling and your feet are flat on the floor.  Tense your lower abdominal muscles to press your low back into the floor. This motion will rotate your pelvis so that your tail bone is scooping upwards rather than pointing at your feet or into the floor.  With a gentle tension and even breathing, hold this position for __________ seconds. Repeat __________ times. Complete this exercise __________ times per day.  STRENGTHENING  Abdominals,  Crunches   Lie on a firm bed or floor. Keeping your legs in front of you, bend your knees so they are both pointed toward the ceiling and your feet are flat on the floor. Cross your arms over your chest.  Slightly tip your chin down without bending your neck.  Tense your abdominals and slowly lift your trunk high enough to just clear your shoulder blades. Lifting higher can put excessive stress on the low back and does not further strengthen your abdominal muscles.  Control your return to the starting position. Repeat __________ times. Complete this exercise __________ times per day.  STRENGTHENING  Quadruped, Opposite UE/LE Lift  Assume a hands and knees position on a firm surface. Keep your hands under your shoulders and your knees under your hips. You may place padding under your knees for comfort.  Find your neutral spine and gently tense your abdominal muscles so that you can maintain this position. Your shoulders and hips should form a rectangle that is parallel with the floor and is not twisted.  Keeping your trunk steady, lift your right hand no higher than your shoulder and then your left leg no higher than your hip. Make sure you are not holding your breath. Hold this position __________ seconds.  Continuing to keep your abdominal muscles tense and your back steady, slowly return to your starting position. Repeat with the opposite arm and leg. Repeat __________ times. Complete this exercise __________ times per day.  STRENGTHENING  Abdominals and Quadriceps, Straight Leg Raise   Lie on a firm bed or floor with both legs extended in front of you.  Keeping one leg in contact with the floor, bend the other knee so that your foot can rest flat on the floor.  Find your neutral spine, and tense your abdominal muscles to maintain your spinal position throughout the exercise.  Slowly lift your straight leg off the floor about 6 inches for a count of 15, making sure to not hold your  breath.  Still keeping your neutral spine, slowly lower your leg all the way to the floor. Repeat this exercise with each leg __________ times. Complete this exercise __________ times per day. POSTURE AND BODY MECHANICS CONSIDERATIONS - Sciatica Keeping correct posture when sitting, standing or completing your activities will reduce  the stress put on different body tissues, allowing injured tissues a chance to heal and limiting painful experiences. The following are general guidelines for improved posture. Your physician or physical therapist will provide you with any instructions specific to your needs. While reading these guidelines, remember:  The exercises prescribed by your provider will help you have the flexibility and strength to maintain correct postures.  The correct posture provides the optimal environment for your joints to work. All of your joints have less wear and tear when properly supported by a spine with good posture. This means you will experience a healthier, less painful body.  Correct posture must be practiced with all of your activities, especially prolonged sitting and standing. Correct posture is as important when doing repetitive low-stress activities (typing) as it is when doing a single heavy-load activity (lifting). RESTING POSITIONS Consider which positions are most painful for you when choosing a resting position. If you have pain with flexion-based activities (sitting, bending, stooping, squatting), choose a position that allows you to rest in a less flexed posture. You would want to avoid curling into a fetal position on your side. If your pain worsens with extension-based activities (prolonged standing, working overhead), avoid resting in an extended position such as sleeping on your stomach. Most people will find more comfort when they rest with their spine in a more neutral position, neither too rounded nor too arched. Lying on a non-sagging bed on your side with a  pillow between your knees, or on your back with a pillow under your knees will often provide some relief. Keep in mind, being in any one position for a prolonged period of time, no matter how correct your posture, can still lead to stiffness. PROPER SITTING POSTURE In order to minimize stress and discomfort on your spine, you must sit with correct posture Sitting with good posture should be effortless for a healthy body. Returning to good posture is a gradual process. Many people can work toward this most comfortably by using various supports until they have the flexibility and strength to maintain this posture on their own. When sitting with proper posture, your ears will fall over your shoulders and your shoulders will fall over your hips. You should use the back of the chair to support your upper back. Your low back will be in a neutral position, just slightly arched. You may place a small pillow or folded towel at the base of your low back for support.  When working at a desk, create an environment that supports good, upright posture. Without extra support, muscles fatigue and lead to excessive strain on joints and other tissues. Keep these recommendations in mind: CHAIR:   A chair should be able to slide under your desk when your back makes contact with the back of the chair. This allows you to work closely.  The chair's height should allow your eyes to be level with the upper part of your monitor and your hands to be slightly lower than your elbows. BODY POSITION  Your feet should make contact with the floor. If this is not possible, use a foot rest.  Keep your ears over your shoulders. This will reduce stress on your neck and low back. INCORRECT SITTING POSTURES   If you are feeling tired and unable to assume a healthy sitting posture, do not slouch or slump. This puts excessive strain on your back tissues, causing more damage and pain. Healthier options include:  Using more support, like a  lumbar pillow.  Switching tasks to something that requires you to be upright or walking.  Talking a brief walk.  Lying down to rest in a neutral-spine position. PROLONGED STANDING WHILE SLIGHTLY LEANING FORWARD  When completing a task that requires you to lean forward while standing in one place for a long time, place either foot up on a stationary 2-4 inch high object to help maintain the best posture. When both feet are on the ground, the low back tends to lose its slight inward curve. If this curve flattens (or becomes too large), then the back and your other joints will experience too much stress, fatigue more quickly and can cause pain.  CORRECT STANDING POSTURES Proper standing posture should be assumed with all daily activities, even if they only take a few moments, like when brushing your teeth. As in sitting, your ears should fall over your shoulders and your shoulders should fall over your hips. You should keep a slight tension in your abdominal muscles to brace your spine. Your tailbone should point down to the ground, not behind your body, resulting in an over-extended swayback posture.  INCORRECT STANDING POSTURES  Common incorrect standing postures include a forward head, locked knees and/or an excessive swayback. WALKING Walk with an upright posture. Your ears, shoulders and hips should all line-up. PROLONGED ACTIVITY IN A FLEXED POSITION When completing a task that requires you to bend forward at your waist or lean over a low surface, try to find a way to stabilize 3 of 4 of your limbs. You can place a hand or elbow on your thigh or rest a knee on the surface you are reaching across. This will provide you more stability so that your muscles do not fatigue as quickly. By keeping your knees relaxed, or slightly bent, you will also reduce stress across your low back. CORRECT LIFTING TECHNIQUES DO :   Assume a wide stance. This will provide you more stability and the opportunity to  get as close as possible to the object which you are lifting.  Tense your abdominals to brace your spine; then bend at the knees and hips. Keeping your back locked in a neutral-spine position, lift using your leg muscles. Lift with your legs, keeping your back straight.  Test the weight of unknown objects before attempting to lift them.  Try to keep your elbows locked down at your sides in order get the best strength from your shoulders when carrying an object.  Always ask for help when lifting heavy or awkward objects. INCORRECT LIFTING TECHNIQUES DO NOT:   Lock your knees when lifting, even if it is a small object.  Bend and twist. Pivot at your feet or move your feet when needing to change directions.  Assume that you cannot safely pick up a paperclip without proper posture. Document Released: 09/30/2005 Document Revised: 12/23/2011 Document Reviewed: 01/12/2009 [facility] Patient Information 2014 [place], [place].

## 2014-08-07 ENCOUNTER — Other Ambulatory Visit: Payer: Self-pay | Admitting: Physician Assistant

## 2014-08-08 ENCOUNTER — Other Ambulatory Visit: Payer: Self-pay | Admitting: Physician Assistant

## 2014-10-25 ENCOUNTER — Other Ambulatory Visit: Payer: Self-pay | Admitting: Family Medicine

## 2014-10-25 ENCOUNTER — Ambulatory Visit (INDEPENDENT_AMBULATORY_CARE_PROVIDER_SITE_OTHER): Payer: 59 | Admitting: Family Medicine

## 2014-10-25 ENCOUNTER — Encounter: Payer: Self-pay | Admitting: Family Medicine

## 2014-10-25 ENCOUNTER — Ambulatory Visit (INDEPENDENT_AMBULATORY_CARE_PROVIDER_SITE_OTHER): Payer: 59

## 2014-10-25 VITALS — BP 138/94 | HR 69 | Temp 97.4°F | Resp 16 | Ht 65.0 in | Wt 175.0 lb

## 2014-10-25 DIAGNOSIS — Z1211 Encounter for screening for malignant neoplasm of colon: Secondary | ICD-10-CM

## 2014-10-25 DIAGNOSIS — I10 Essential (primary) hypertension: Secondary | ICD-10-CM

## 2014-10-25 DIAGNOSIS — L259 Unspecified contact dermatitis, unspecified cause: Secondary | ICD-10-CM

## 2014-10-25 DIAGNOSIS — Z1239 Encounter for other screening for malignant neoplasm of breast: Secondary | ICD-10-CM

## 2014-10-25 DIAGNOSIS — Z130 Encounter for screening for diseases of the blood and blood-forming organs and certain disorders involving the immune mechanism: Secondary | ICD-10-CM

## 2014-10-25 DIAGNOSIS — R3915 Urgency of urination: Secondary | ICD-10-CM

## 2014-10-25 DIAGNOSIS — E785 Hyperlipidemia, unspecified: Secondary | ICD-10-CM

## 2014-10-25 DIAGNOSIS — E039 Hypothyroidism, unspecified: Secondary | ICD-10-CM

## 2014-10-25 DIAGNOSIS — Z0000 Encounter for general adult medical examination without abnormal findings: Secondary | ICD-10-CM

## 2014-10-25 DIAGNOSIS — R739 Hyperglycemia, unspecified: Secondary | ICD-10-CM

## 2014-10-25 DIAGNOSIS — M25551 Pain in right hip: Secondary | ICD-10-CM

## 2014-10-25 DIAGNOSIS — E669 Obesity, unspecified: Secondary | ICD-10-CM

## 2014-10-25 LAB — CBC
HCT: 41.7 % (ref 36.0–46.0)
Hemoglobin: 14 g/dL (ref 12.0–15.0)
MCH: 27.8 pg (ref 26.0–34.0)
MCHC: 33.6 g/dL (ref 30.0–36.0)
MCV: 82.9 fL (ref 78.0–100.0)
MPV: 9.3 fL (ref 8.6–12.4)
Platelets: 499 10*3/uL — ABNORMAL HIGH (ref 150–400)
RBC: 5.03 MIL/uL (ref 3.87–5.11)
RDW: 14.2 % (ref 11.5–15.5)
WBC: 6.7 10*3/uL (ref 4.0–10.5)

## 2014-10-25 LAB — POCT URINALYSIS DIPSTICK
BILIRUBIN UA: NEGATIVE
Glucose, UA: NEGATIVE
Ketones, UA: NEGATIVE
LEUKOCYTES UA: NEGATIVE
Nitrite, UA: NEGATIVE
PH UA: 6
Protein, UA: NEGATIVE
RBC UA: NEGATIVE
SPEC GRAV UA: 1.015
Urobilinogen, UA: 0.2

## 2014-10-25 LAB — COMPREHENSIVE METABOLIC PANEL
ALBUMIN: 4.2 g/dL (ref 3.5–5.2)
ALT: 13 U/L (ref 0–35)
AST: 14 U/L (ref 0–37)
Alkaline Phosphatase: 103 U/L (ref 39–117)
BILIRUBIN TOTAL: 0.3 mg/dL (ref 0.2–1.2)
BUN: 13 mg/dL (ref 6–23)
CO2: 26 mEq/L (ref 19–32)
CREATININE: 0.66 mg/dL (ref 0.50–1.10)
Calcium: 9.5 mg/dL (ref 8.4–10.5)
Chloride: 103 mEq/L (ref 96–112)
Glucose, Bld: 86 mg/dL (ref 70–99)
Potassium: 4.5 mEq/L (ref 3.5–5.3)
Sodium: 139 mEq/L (ref 135–145)
Total Protein: 7.4 g/dL (ref 6.0–8.3)

## 2014-10-25 LAB — POCT UA - MICROSCOPIC ONLY
CRYSTALS, UR, HPF, POC: NEGATIVE
Casts, Ur, LPF, POC: NEGATIVE
Mucus, UA: POSITIVE
Yeast, UA: NEGATIVE

## 2014-10-25 LAB — LIPID PANEL
Cholesterol: 234 mg/dL — ABNORMAL HIGH (ref 0–200)
HDL: 53 mg/dL (ref >39)
LDL Cholesterol: 166 mg/dL — ABNORMAL HIGH (ref 0–99)
TRIGLYCERIDES: 76 mg/dL (ref <150)
Total CHOL/HDL Ratio: 4.4 Ratio
VLDL: 15 mg/dL (ref 0–40)

## 2014-10-25 LAB — TSH: TSH: 7.074 u[IU]/mL — ABNORMAL HIGH (ref 0.350–4.500)

## 2014-10-25 MED ORDER — LISINOPRIL-HYDROCHLOROTHIAZIDE 10-12.5 MG PO TABS: 1.0000 | ORAL_TABLET | Freq: Every day | ORAL | Status: DC

## 2014-10-25 MED ORDER — CLOBETASOL PROPIONATE 0.05 % EX CREA: TOPICAL_CREAM | Freq: Two times a day (BID) | CUTANEOUS | Status: DC

## 2014-10-25 MED ORDER — MELOXICAM 15 MG PO TABS: 15.0000 mg | ORAL_TABLET | Freq: Every day | ORAL | Status: DC

## 2014-10-25 NOTE — Patient Instructions (Addendum)
Why follow it? Research shows. . Those who follow the Mediterranean diet have a reduced risk of heart disease  . The diet is associated with a reduced incidence of Parkinson's and Alzheimer's diseases . People following the diet may have longer life expectancies and lower rates of chronic diseases  . The Dietary Guidelines for Americans recommends the Mediterranean diet as an eating plan to promote health and prevent disease  What Is the Mediterranean Diet?  . Healthy eating plan based on typical foods and recipes of Mediterranean-style cooking . The diet is primarily a plant based diet; these foods should make up a majority of meals   Starches - Plant based foods should make up a majority of meals - They are an important sources of vitamins, minerals, energy, antioxidants, and fiber - Choose whole grains, foods high in fiber and minimally processed items  - Typical grain sources include wheat, oats, barley, corn, brown rice, bulgar, farro, millet, polenta, couscous  - Various types of beans include chickpeas, lentils, fava beans, black beans, white beans   Fruits  Veggies - Large quantities of antioxidant rich fruits & veggies; 6 or more servings  - Vegetables can be eaten raw or lightly drizzled with oil and cooked  - Vegetables common to the traditional Mediterranean Diet include: artichokes, arugula, beets, broccoli, brussel sprouts, cabbage, carrots, celery, collard greens, cucumbers, eggplant, kale, leeks, lemons, lettuce, mushrooms, okra, onions, peas, peppers, potatoes, pumpkin, radishes, rutabaga, shallots, spinach, sweet potatoes, turnips, zucchini - Fruits common to the Mediterranean Diet include: apples, apricots, avocados, cherries, clementines, dates, figs, grapefruits, grapes, melons, nectarines, oranges, peaches, pears, pomegranates, strawberries, tangerines  Fats - Replace butter and margarine with healthy oils, such as olive oil, canola oil, and tahini  - Limit nuts to no  more than a handful a day  - Nuts include walnuts, almonds, pecans, pistachios, pine nuts  - Limit or avoid candied, honey roasted or heavily salted nuts - Olives are central to the Mediterranean diet - can be eaten whole or used in a variety of dishes   Meats Protein - Limiting red meat: no more than a few times a month - When eating red meat: choose lean cuts and keep the portion to the size of deck of cards - Eggs: approx. 0 to 4 times a week  - Fish and lean poultry: at least 2 a week  - Healthy protein sources include, chicken, turkey, lean beef, lamb - Increase intake of seafood such as tuna, salmon, trout, mackerel, shrimp, scallops - Avoid or limit high fat processed meats such as sausage and bacon  Dairy - Include moderate amounts of low fat dairy products  - Focus on healthy dairy such as fat free yogurt, skim milk, low or reduced fat cheese - Limit dairy products higher in fat such as whole or 2% milk, cheese, ice cream  Alcohol - Moderate amounts of red wine is ok  - No more than 5 oz daily for women (all ages) and men older than age 65  - No more than 10 oz of wine daily for men younger than 65  Other - Limit sweets and other desserts  - Use herbs and spices instead of salt to flavor foods  - Herbs and spices common to the traditional Mediterranean Diet include: basil, bay leaves, chives, cloves, cumin, fennel, garlic, lavender, marjoram, mint, oregano, parsley, pepper, rosemary, sage, savory, sumac, tarragon, thyme   It's not just a diet, it's a lifestyle:  . The Mediterranean diet includes   lifestyle factors typical of those in the region  . Foods, drinks and meals are best eaten with others and savored . Daily physical activity is important for overall good health . This could be strenuous exercise like running and aerobics . This could also be more leisurely activities such as walking, housework, yard-work, or taking the stairs . Moderation is the key; a balanced and  healthy diet accommodates most foods and drinks . Consider portion sizes and frequency of consumption of certain foods   Meal Ideas & Options:  . Breakfast:  o Whole wheat toast or whole wheat English muffins with peanut butter & hard boiled egg o Steel cut oats topped with apples & cinnamon and skim milk  o Fresh fruit: banana, strawberries, melon, berries, peaches  o Smoothies: strawberries, bananas, greek yogurt, peanut butter o Low fat greek yogurt with blueberries and granola  o Egg white omelet with spinach and mushrooms o Breakfast couscous: whole wheat couscous, apricots, skim milk, cranberries  . Sandwiches:  o Hummus and grilled vegetables (peppers, zucchini, squash) on whole wheat bread   o Grilled chicken on whole wheat pita with lettuce, tomatoes, cucumbers or tzatziki  o Tuna salad on whole wheat bread: tuna salad made with greek yogurt, olives, red peppers, capers, green onions o Garlic rosemary lamb pita: lamb sauted with garlic, rosemary, salt & pepper; add lettuce, cucumber, greek yogurt to pita - flavor with lemon juice and black pepper  . Seafood:  o Mediterranean grilled salmon, seasoned with garlic, basil, parsley, lemon juice and black pepper o Shrimp, lemon, and spinach whole-grain pasta salad made with low fat greek yogurt  o Seared scallops with lemon orzo  o Seared tuna steaks seasoned salt, pepper, coriander topped with tomato mixture of olives, tomatoes, olive oil, minced garlic, parsley, green onions and cappers  . Meats:  o Herbed greek chicken salad with kalamata olives, cucumber, feta  o Red bell peppers stuffed with spinach, bulgur, lean ground beef (or lentils) & topped with feta   o Kebabs: skewers of chicken, tomatoes, onions, zucchini, squash  o [place] burgers: made with red onions, mint, dill, lemon juice, feta cheese topped with roasted red peppers . Vegetarian o Cucumber salad: cucumbers, artichoke hearts, celery, red onion, feta cheese, tossed in  olive oil & lemon juice  o Hummus and whole grain pita points with a greek salad (lettuce, tomato, feta, olives, cucumbers, red onion) o Lentil soup with celery, carrots made with vegetable broth, garlic, salt and pepper  o Tabouli salad: parsley, bulgur, mint, scallions, cucumbers, tomato, radishes, lemon juice, olive oil, salt and pepper.      Hip Exercises RANGE OF MOTION (ROM) AND STRETCHING EXERCISES  These exercises may help you when beginning to rehabilitate your injury. Doing them too aggressively can worsen your condition. Complete them slowly and gently. Your symptoms may resolve with or without further involvement from your physician, physical therapist or athletic trainer. While completing these exercises, remember:   Restoring tissue flexibility helps normal motion to return to the joints. This allows healthier, less painful movement and activity.  An effective stretch should be held for at least 30 seconds.  A stretch should never be painful. You should only feel a gentle lengthening or release in the stretched tissue. If these stretches worsen your symptoms even when done gently, consult your physician, physical therapist or athletic trainer. STRETCH - Hamstrings, Supine   Lie on your back. Loop a belt or towel over the ball of your right /  left foot.  Straighten your right / left knee and slowly pull on the belt to raise your leg. Do not allow the right / left knee to bend. Keep your opposite leg flat on the floor.  Raise the leg until you feel a gentle stretch behind your right / left knee or thigh. Hold this position for __________ seconds. Repeat __________ times. Complete this stretch __________ times per day.  STRETCH - Hip Rotators   Lie on your back on a firm surface. Grasp your right / left knee with your right / left hand and your ankle with your opposite hand.  Keeping your hips and shoulders firmly planted, gently pull your right / left knee and rotate your lower  leg toward your opposite shoulder until you feel a stretch in your buttocks.  Hold this stretch for __________ seconds. Repeat this stretch __________ times. Complete this stretch __________ times per day. STRETCH - Hamstrings/Adductors, V-Sit   Sit on the floor with your legs extended in a large "V," keeping your knees straight.  With your head and chest upright, bend at your waist reaching for your right foot to stretch your left adductors.  You should feel a stretch in your left inner thigh. Hold for __________ seconds.  Return to the upright position to relax your leg muscles.  Continuing to keep your chest upright, bend straight forward at your waist to stretch your hamstrings.  You should feel a stretch behind both of your thighs and/or knees. Hold for __________ seconds.  Return to the upright position to relax your leg muscles.  Repeat steps 2 through 4 for opposite leg. Repeat __________ times. Complete this exercise __________ times per day.  STRETCHING - Hip Flexors, Lunge  Half kneel with your right / left knee on the floor and your opposite knee bent and directly over your ankle.  Keep good posture with your head over your shoulders. Tighten your buttocks to point your tailbone downward; this will prevent your back from arching too much.  You should feel a gentle stretch in the front of your thigh and/or hip. If you do not feel any resistance, slightly slide your opposite foot forward and then slowly lunge forward so your knee once again lines up over your ankle. Be sure your tailbone remains pointed downward.  Hold this stretch for __________ seconds. Repeat __________ times. Complete this stretch __________ times per day. STRENGTHENING EXERCISES These exercises may help you when beginning to rehabilitate your injury. They may resolve your symptoms with or without further involvement from your physician, physical therapist or athletic trainer. While completing these  exercises, remember:   Muscles can gain both the endurance and the strength needed for everyday activities through controlled exercises.  Complete these exercises as instructed by your physician, physical therapist or athletic trainer. Progress the resistance and repetitions only as guided.  You may experience muscle soreness or fatigue, but the pain or discomfort you are trying to eliminate should never worsen during these exercises. If this pain does worsen, stop and make certain you are following the directions exactly. If the pain is still present after adjustments, discontinue the exercise until you can discuss the trouble with your clinician. STRENGTH - Hip Extensors, Bridge   Lie on your back on a firm surface. Bend your knees and place your feet flat on the floor.  Tighten your buttocks muscles and lift your bottom off the floor until your trunk is level with your thighs. You should feel the muscles in  your buttocks and back of your thighs working. If you do not feel these muscles, slide your feet 1-2 inches further away from your buttocks.  Hold this position for __________ seconds.  Slowly lower your hips to the starting position and allow your buttock muscles relax completely before beginning the next repetition.  If this exercise is too easy, you may cross your arms over your chest. Repeat __________ times. Complete this exercise __________ times per day.  STRENGTH - Hip Abductors, Straight Leg Raises  Be aware of your form throughout the entire exercise so that you exercise the correct muscles. Sloppy form means that you are not strengthening the correct muscles.  Lie on your side so that your head, shoulders, knee and hip line up. You may bend your lower knee to help maintain your balance. Your right / left leg should be on top.  Roll your hips slightly forward, so that your hips are stacked directly over each other and your right / left knee is facing forward.  Lift your top  leg up 4-6 inches, leading with your heel. Be sure that your foot does not drift forward or that your knee does not roll toward the ceiling.  Hold this position for __________ seconds. You should feel the muscles in your outer hip lifting (you may not notice this until your leg begins to tire).  Slowly lower your leg to the starting position. Allow the muscles to fully relax before beginning the next repetition. Repeat __________ times. Complete this exercise __________ times per day.  STRENGTH - Hip Adductors, Straight Leg Raises   Lie on your side so that your head, shoulders, knee and hip line up. You may place your upper foot in front to help maintain your balance. Your right / left leg should be on the bottom.  Roll your hips slightly forward, so that your hips are stacked directly over each other and your right / left knee is facing forward.  Tense the muscles in your inner thigh and lift your bottom leg 4-6 inches. Hold this position for __________ seconds.  Slowly lower your leg to the starting position. Allow the muscles to fully relax before beginning the next repetition. Repeat __________ times. Complete this exercise __________ times per day.  STRENGTH - Quadriceps, Straight Leg Raises  Quality counts! Watch for signs that the quadriceps muscle is working to insure you are strengthening the correct muscles and not "cheating" by substituting with healthier muscles.  Lay on your back with your right / left leg extended and your opposite knee bent.  Tense the muscles in the front of your right / left thigh. You should see either your knee cap slide up or increased dimpling just above the knee. Your thigh may even quiver.  Tighten these muscles even more and raise your leg 4 to 6 inches off the floor. Hold for right / left seconds.  Keeping these muscles tense, lower your leg.  Relax the muscles slowly and completely in between each repetition. Repeat __________ times. Complete  this exercise __________ times per day.  STRENGTH - Hip Abductors, Standing  Tie one end of a rubber exercise band/tubing to a secure surface (table, pole) and tie a loop at the other end.  Place the loop around your right / left ankle. Keeping your ankle with the band directly opposite of the secured end, step away until there is tension in the tube/band.  Hold onto a chair as needed for balance.  Keeping your back upright,  your shoulders over your hips, and your toes pointing forward, lift your right / left leg out to your side. Be sure to lift your leg with your hip muscles. Do not "throw" your leg or tip your body to lift your leg.  Slowly and with control, return to the starting position. Repeat exercise __________ times. Complete this exercise __________ times per day.  STRENGTH - Quadriceps, Squats  Stand in a door frame so that your feet and knees are in line with the frame.  Use your hands for balance, not support, on the frame.  Slowly lower your weight, bending at the hips and knees. Keep your lower legs upright so that they are parallel with the door frame. Squat only within the range that does not increase your knee pain. Never let your hips drop below your knees.  Slowly return upright, pushing with your legs, not pulling with your hands. Document Released: 10/18/2005 Document Revised: 12/23/2011 Document Reviewed: 01/12/2009 [facility] Patient Information 2015 [place], [place]. This information is not intended to replace advice given to you by your health care provider. Make sure you discuss any questions you have with your health care provider.

## 2014-10-25 NOTE — Progress Notes (Signed)
Subjective:    Patient ID: Stacey Mason, female    DOB: 02-06-1961, 54 y.o.   MRN: [id number]  HPI This is a very pleasant 54 yo [place] female who presents today for a CPE and sick visit. She is accompanied by her 56 yo daughter.  Last CPE- unknown, maybe last year Tdap- 02/26/13 Flu- declines Pap- has never had, does not want to have  Mammo- has never had, is willing to have Colonoscopy- patient willing to have  Patient had vomiting illness yesterday. Vomited 1x yesterday. Feels better today. No fever/chills. Her daughter was also similarly ill.   Patient works in a [employer]. She has had pain in back, hurts more after working. Has pain every day. Pain is right lower back and into buttock. This started 3 years ago. Takes ibuprofen 400 mg without relief. No numbness/tingling/weakness/falls. Was seen in 6/15 and diagnosed with sciatica. Was given Meloxicam and flexeril. She used all the melocicam, but can not recall if it helped. She took a couple of flexeril and doesn't remember if she got any relief. She still has plenty of the flexeril at home.   Patient has lost 17 pounds in last 6 months. She has been eating one large meal mid morning, then she eats a couple of small snacks. Usually fruit.   She has a known perforated left TM. She is planning on having surgery after she goes home to [place] this summer. She denies hearing loss, pain.  She has a history of contact dermatitis of her shins and forearms. She uses clobetasol ointment with good results. She does not currently have a rash. She would like a refill for recurrence.   Past Medical History  Diagnosis Date  . Hypertension   . Thyroid disease   . Hyperlipidemia   . Cardiac murmur    Past Surgical History  Procedure Laterality Date  . Breast surgery     Family History  Problem Relation Age of Onset  . Diabetes Father   . Hyperlipidemia Father   . Hypertension Father    History    Substance Use Topics  . Smoking status: Never Smoker   . Smokeless tobacco: Not on file  . Alcohol Use: No   Review of Systems  Constitutional: Negative.   Eyes: Positive for visual disturbance.  Respiratory: Negative.   Cardiovascular: Negative.   Gastrointestinal: Negative.   Endocrine: Positive for polyuria.  Genitourinary: Negative.   Musculoskeletal: Positive for arthralgias.  Skin: Positive for rash.  Allergic/Immunologic: Negative.   Neurological: Positive for headaches.  Hematological: Negative.   Psychiatric/Behavioral: Negative.       Objective:   Physical Exam  Constitutional: She is oriented to person, place, and time. She appears well-developed and well-nourished.  HENT:  Head: Normocephalic and atraumatic.  Right Ear: External ear and ear canal normal. Tympanic membrane is scarred.  Left Ear: External ear normal. Tympanic membrane is perforated.  Nose: Nose normal.  Mouth/Throat: Oropharynx is clear and moist. No oropharyngeal exudate.  Eyes: Conjunctivae and EOM are normal. Pupils are equal, round, and reactive to light. Right eye exhibits no discharge. Left eye exhibits no discharge. No scleral icterus.  Neck: Normal range of motion. Neck supple. No thyromegaly present.  Cardiovascular: Normal rate, regular rhythm and normal heart sounds.   Pulmonary/Chest: Effort normal and breath sounds normal. Right breast exhibits no inverted nipple, no mass, no nipple discharge, no skin change and no tenderness. Left breast exhibits skin change. Left breast exhibits  no inverted nipple, no nipple discharge and no tenderness.    Abdominal: Soft. Bowel sounds are normal. She exhibits no distension and no mass. There is no rebound and no guarding.  Musculoskeletal: Normal range of motion. She exhibits no edema.       Right hip: She exhibits tenderness (pain with palpation over right buttock, pain with flexion.). She exhibits normal range of motion, normal strength, no bony  tenderness and no crepitus.       Lumbar back: Normal.  Lymphadenopathy:    She has no cervical adenopathy.  Neurological: She is alert and oriented to person, place, and time. She has normal reflexes.  Skin: Skin is warm and dry.  Psychiatric: She has a normal mood and affect. Her behavior is normal. Judgment and thought content normal.  Vitals reviewed. BP 138/94 mmHg  Pulse 69  Temp(Src) 97.4 F (36.3 C) (Oral)  Resp 16  Ht 5\' 5"  (1.651 m)  Wt 175 lb (79.379 kg)  BMI 29.12 kg/m2  SpO2 100%  Right hip- [facility] reading (PRIMARY) by  Dr. Everlene Farrier- no acute abnormality.     Assessment & Plan:  1. Annual physical exam  2. Right hip pain - DG HIP UNILAT WITH PELVIS 1V RIGHT; Future- normal Xray - meloxicam (MOBIC) 15 MG tablet; Take 1 tablet (15 mg total) by mouth daily.  Dispense: 30 tablet; Refill: 0 - Try heat, massage, written exercises provided to patient   3. Obesity -Encouraged continued weight loss and provided information regarding Mediterranean Diet.  4. Hypothyroidism, unspecified hypothyroidism type - TSH  5. Essential hypertension - Comprehensive metabolic panel - lisinopril-hydrochlorothiazide (PRINZIDE,ZESTORETIC) 10-12.5 MG per tablet; Take 1 tablet by mouth daily.  Dispense: 90 tablet; Refill: 2  6. Hyperglycemia - Comprehensive metabolic panel - Lipid panel - Hemoglobin A1c  7. Hyperlipidemia   8. Screening for breast cancer - MM Digital Screening; Future  9. Urinary urgency - POCT UA - Microscopic Only - POCT urinalysis dipstick  10. Screening for deficiency anemia - CBC  11. Screening for colon cancer - Ambulatory referral to Gastroenterology  12. Contact dermatitis - clobetasol cream (TEMOVATE) 0.05 %; Apply topically 2 (two) times daily.  Dispense: 60 g; Refill: 2   Elby Beck, FNP-BC  [facility]  10/27/2014 8:31 PM

## 2014-10-25 NOTE — Progress Notes (Signed)
   Subjective:    Patient ID: Stacey Mason, female    DOB: Jan 27, 1961, 54 y.o.   MRN: [id number]  HPI    Review of Systems  Constitutional: Negative.   Eyes: Positive for visual disturbance.  Respiratory: Negative.   Cardiovascular: Negative.   Gastrointestinal: Negative.   Endocrine: Positive for polyuria.  Genitourinary: Negative.   Musculoskeletal: Negative.   Skin: Negative.   Allergic/Immunologic: Negative.   Neurological: Positive for headaches.  Hematological: Negative.   Psychiatric/Behavioral: Negative.        Objective:   Physical Exam        Assessment & Plan:

## 2014-10-26 ENCOUNTER — Telehealth: Payer: Self-pay

## 2014-10-26 ENCOUNTER — Other Ambulatory Visit: Payer: Self-pay | Admitting: Family Medicine

## 2014-10-26 DIAGNOSIS — E039 Hypothyroidism, unspecified: Secondary | ICD-10-CM

## 2014-10-26 LAB — HEMOGLOBIN A1C
HEMOGLOBIN A1C: 5.9 % — AB (ref <5.7)
Mean Plasma Glucose: 123 mg/dL — ABNORMAL HIGH (ref <117)

## 2014-10-26 MED ORDER — LEVOTHYROXINE SODIUM 100 MCG PO TABS
100.0000 ug | ORAL_TABLET | Freq: Every day | ORAL | Status: DC
Start: 2014-10-26 — End: 2015-05-25

## 2014-10-26 MED ORDER — LEVOTHYROXINE SODIUM 100 MCG PO TABS: 100.0000 ug | ORAL_TABLET | Freq: Every day | ORAL | Status: DC

## 2014-10-26 NOTE — Telephone Encounter (Signed)
Pharm called. You had only sent in 1 tablet of the levothyroxine with 3 Rf. Changed it to #30.

## 2014-11-02 ENCOUNTER — Telehealth: Payer: Self-pay

## 2014-11-02 NOTE — Telephone Encounter (Signed)
Pt called saying someone from our office had called her. She is wanting a call back. Please advise at [phone]

## 2014-11-03 NOTE — Telephone Encounter (Signed)
lmom to cb. 

## 2014-11-03 NOTE — Telephone Encounter (Signed)
LM for pt to rtn call to the Lab for her lab results.

## 2014-11-09 ENCOUNTER — Telehealth: Payer: Self-pay | Admitting: Family Medicine

## 2014-11-09 ENCOUNTER — Encounter: Payer: Self-pay | Admitting: Family Medicine

## 2014-11-09 NOTE — Telephone Encounter (Signed)
Spoke to pt concerning lab results she is aware and was transferred to schedule appt.

## 2014-11-29 ENCOUNTER — Ambulatory Visit: Payer: Self-pay

## 2015-02-15 ENCOUNTER — Ambulatory Visit (INDEPENDENT_AMBULATORY_CARE_PROVIDER_SITE_OTHER): Payer: 59 | Admitting: Physician Assistant

## 2015-02-15 VITALS — BP 130/88 | HR 75 | Temp 97.9°F | Resp 18 | Ht 64.5 in | Wt 187.0 lb

## 2015-02-15 DIAGNOSIS — Z760 Encounter for issue of repeat prescription: Secondary | ICD-10-CM

## 2015-02-15 DIAGNOSIS — M545 Low back pain, unspecified: Secondary | ICD-10-CM

## 2015-02-15 DIAGNOSIS — B372 Candidiasis of skin and nail: Secondary | ICD-10-CM | POA: Diagnosis not present

## 2015-02-15 DIAGNOSIS — I10 Essential (primary) hypertension: Secondary | ICD-10-CM | POA: Diagnosis not present

## 2015-02-15 MED ORDER — CLOTRIMAZOLE-BETAMETHASONE 1-0.05 % EX CREA: 1.0000 "application " | TOPICAL_CREAM | Freq: Two times a day (BID) | CUTANEOUS | Status: DC

## 2015-02-15 MED ORDER — NAPROXEN 500 MG PO TABS: 500.0000 mg | ORAL_TABLET | Freq: Two times a day (BID) | ORAL | Status: DC

## 2015-02-15 MED ORDER — LEVOTHYROXINE SODIUM 100 MCG PO TABS: 100.0000 ug | ORAL_TABLET | Freq: Every day | ORAL | Status: DC

## 2015-02-15 MED ORDER — LISINOPRIL-HYDROCHLOROTHIAZIDE 10-12.5 MG PO TABS: 1.0000 | ORAL_TABLET | Freq: Every day | ORAL | Status: DC

## 2015-02-15 NOTE — Progress Notes (Signed)
02/15/2015 at 8:54 PM  Stacey Mason / DOB: 1961/01/18 / MRN: [id number]  The patient has HTN (hypertension); Hypothyroidism; Hyperglycemia; and Hyperlipidemia on her problem list.  SUBJECTIVE  Chief complaint: Leg Pain; Skin Discoloration; and Medication Refills  Stacey Mason is a 54 y.o. female here today with the chief complaint of an itchy rash under her right breast.  The rash has been there for "months" and she has not tried anything.  She complains of increased itching at night and after showering. She feels the rash is spreading.   She complains of leg pain that radiates from her back to the bottom of her foot.  She has a history of back pain that has been evaluated in the past. She was diagnosed with sciatica on 03/28/14.  She is not taking any medication for this pain right now.  Her pain is made by prolonged sitting, standing, and ambulation. She gets good relief with rest.  She denies changes in bowel, bladder, and denies weakness and loss of leg function.    She is heading to the [place] and would like a 90 day supply of her BP medication and Levothyroxine.  She takes these medications on a daily basis, but can only get them in a thirty day supply. She will be in the [place] for 90 days and is leaving in the next few days.    She  has a past medical history of Hypertension; Thyroid disease; Hyperlipidemia; and Cardiac murmur.    Medications reviewed and updated by myself where necessary, and exist elsewhere in the encounter.   Stacey Mason has No Known Allergies. She  reports that she has never smoked. She does not have any smokeless tobacco history on file. She reports that she does not drink alcohol or use illicit drugs. She  reports that she does not engage in sexual activity. The patient  has past surgical history that includes Breast surgery.  Her family history includes Diabetes in her father; Hyperlipidemia in her father; Hypertension in her father.  Review of Systems    Constitutional: Negative for fever and chills.  Respiratory: Negative for cough.   Cardiovascular: Negative for chest pain.  Gastrointestinal: Negative for abdominal pain.  Genitourinary: Negative for dysuria, urgency, frequency and hematuria.  Musculoskeletal: Positive for back pain. Negative for joint pain and falls.  Skin: Positive for itching and rash.  Neurological: Negative for dizziness, tingling and headaches.  Psychiatric/Behavioral: Negative for depression.    OBJECTIVE  Her  height is 5' 4.5" (1.638 m) and weight is 187 lb (84.823 kg). Her oral temperature is 97.9 F (36.6 C). Her blood pressure is 130/88 and her pulse is 75. Her respiration is 18 and oxygen saturation is 97%.  The patient's body mass index is 31.61 kg/(m^2).  Physical Exam  Vitals reviewed. Constitutional: She is oriented to person, place, and time. She appears well-developed and well-nourished. No distress.  Cardiovascular: Normal rate.   Respiratory: Effort normal.    GI: Soft. There is tenderness in the suprapubic area. There is no CVA tenderness.  Musculoskeletal: Normal range of motion.       Lumbar back: She exhibits no swelling, no edema, no deformity and no spasm.       Back:  Neurological: She is alert and oriented to person, place, and time. She has normal strength. She displays no tremor. No cranial nerve deficit or sensory deficit. She exhibits normal muscle tone. Gait normal.  Reflex Scores:      Tricep reflexes are  2+ on the right side and 2+ on the left side.      Bicep reflexes are 2+ on the right side and 2+ on the left side.      Brachioradialis reflexes are 2+ on the right side and 2+ on the left side.      Patellar reflexes are 2+ on the right side and 2+ on the left side.      Achilles reflexes are 2+ on the right side and 2+ on the left side. Skin: Skin is warm and dry. She is not diaphoretic.  Psychiatric: She has a normal mood and affect. Her behavior is normal. Judgment and  thought content normal.    No results found for this or any previous visit (from the past 24 hour(s)).  ASSESSMENT & PLAN  Stacey Mason was seen today for leg pain, skin discoloration and medication refills.  Diagnoses and all orders for this visit:  Yeast dermatitis Orders: -     clotrimazole-betamethasone (LOTRISONE) cream; Apply 1 application topically 2 (two) times daily.  Medication refill Orders: -     levothyroxine (SYNTHROID, LEVOTHROID) 100 MCG tablet; Take 1 tablet (100 mcg total) by mouth daily. For travel to [place].  Right-sided low back pain without sciatica Orders: -     naproxen (NAPROSYN) 500 MG tablet; Take 1 tablet (500 mg total) by mouth 2 (two) times daily with a meal.  Essential hypertension Orders: -     lisinopril-hydrochlorothiazide (PRINZIDE,ZESTORETIC) 10-12.5 MG per tablet; Take 1 tablet by mouth daily.    The patient was advised to call or come back to clinic if she does not see an improvement in symptoms, or worsens with the above plan.   Philis Fendt, MHS, PA-C [facility] 02/15/2015 8:54 PM

## 2015-02-21 ENCOUNTER — Other Ambulatory Visit: Payer: Self-pay

## 2015-02-21 DIAGNOSIS — Z760 Encounter for issue of repeat prescription: Secondary | ICD-10-CM

## 2015-02-21 DIAGNOSIS — I10 Essential (primary) hypertension: Secondary | ICD-10-CM

## 2015-02-21 MED ORDER — LISINOPRIL-HYDROCHLOROTHIAZIDE 10-12.5 MG PO TABS: 1.0000 | ORAL_TABLET | Freq: Every day | ORAL | Status: DC

## 2015-02-21 MED ORDER — LEVOTHYROXINE SODIUM 100 MCG PO TABS: 100.0000 ug | ORAL_TABLET | Freq: Every day | ORAL | Status: DC

## 2015-04-16 ENCOUNTER — Other Ambulatory Visit: Payer: Self-pay | Admitting: Physician Assistant

## 2015-05-25 ENCOUNTER — Ambulatory Visit (INDEPENDENT_AMBULATORY_CARE_PROVIDER_SITE_OTHER): Payer: 59 | Admitting: Family Medicine

## 2015-05-25 ENCOUNTER — Encounter: Payer: Self-pay | Admitting: Family Medicine

## 2015-05-25 ENCOUNTER — Ambulatory Visit (INDEPENDENT_AMBULATORY_CARE_PROVIDER_SITE_OTHER): Payer: 59

## 2015-05-25 VITALS — BP 122/80 | HR 79 | Temp 98.4°F | Resp 16 | Ht 65.0 in | Wt 184.2 lb

## 2015-05-25 DIAGNOSIS — M5441 Lumbago with sciatica, right side: Secondary | ICD-10-CM

## 2015-05-25 DIAGNOSIS — Z79899 Other long term (current) drug therapy: Secondary | ICD-10-CM | POA: Diagnosis not present

## 2015-05-25 DIAGNOSIS — K5900 Constipation, unspecified: Secondary | ICD-10-CM

## 2015-05-25 DIAGNOSIS — R739 Hyperglycemia, unspecified: Secondary | ICD-10-CM

## 2015-05-25 DIAGNOSIS — I10 Essential (primary) hypertension: Secondary | ICD-10-CM | POA: Diagnosis not present

## 2015-05-25 DIAGNOSIS — R197 Diarrhea, unspecified: Secondary | ICD-10-CM

## 2015-05-25 DIAGNOSIS — R140 Abdominal distension (gaseous): Secondary | ICD-10-CM | POA: Diagnosis not present

## 2015-05-25 DIAGNOSIS — E039 Hypothyroidism, unspecified: Secondary | ICD-10-CM | POA: Diagnosis not present

## 2015-05-25 DIAGNOSIS — R3129 Other microscopic hematuria: Secondary | ICD-10-CM

## 2015-05-25 DIAGNOSIS — E785 Hyperlipidemia, unspecified: Secondary | ICD-10-CM

## 2015-05-25 DIAGNOSIS — D473 Essential (hemorrhagic) thrombocythemia: Secondary | ICD-10-CM | POA: Diagnosis not present

## 2015-05-25 DIAGNOSIS — R312 Other microscopic hematuria: Secondary | ICD-10-CM | POA: Diagnosis not present

## 2015-05-25 DIAGNOSIS — Z760 Encounter for issue of repeat prescription: Secondary | ICD-10-CM | POA: Diagnosis not present

## 2015-05-25 DIAGNOSIS — D75839 Thrombocytosis, unspecified: Secondary | ICD-10-CM

## 2015-05-25 LAB — POCT URINALYSIS DIPSTICK
Bilirubin, UA: NEGATIVE
Blood, UA: NEGATIVE
Glucose, UA: NEGATIVE
Ketones, UA: NEGATIVE
Leukocytes, UA: NEGATIVE
Nitrite, UA: NEGATIVE
PROTEIN UA: NEGATIVE
Urobilinogen, UA: 0.2
pH, UA: 6

## 2015-05-25 LAB — POCT SEDIMENTATION RATE: POCT SED RATE: 42 mm/hr — AB (ref 0–22)

## 2015-05-25 LAB — POCT CBC
GRANULOCYTE PERCENT: 40.4 % (ref 37–80)
HCT, POC: 38.6 % (ref 37.7–47.9)
Hemoglobin: 12 g/dL — AB (ref 12.2–16.2)
LYMPH, POC: 4.1 — AB (ref 0.6–3.4)
MCH, POC: 25.9 pg — AB (ref 27–31.2)
MCHC: 31 g/dL — AB (ref 31.8–35.4)
MCV: 83.8 fL (ref 80–97)
MID (CBC): 0.6 (ref 0–0.9)
MPV: 7.3 fL (ref 0–99.8)
PLATELET COUNT, POC: 496 10*3/uL — AB (ref 142–424)
POC Granulocyte: 3.2 (ref 2–6.9)
POC LYMPH PERCENT: 52.4 %L — AB (ref 10–50)
POC MID %: 7.2 %M (ref 0–12)
RBC: 4.61 M/uL (ref 4.04–5.48)
RDW, POC: 14.6 %
WBC: 7.9 10*3/uL (ref 4.6–10.2)

## 2015-05-25 MED ORDER — METHYLPREDNISOLONE 4 MG PO TBPK: ORAL_TABLET | ORAL | Status: DC

## 2015-05-25 MED ORDER — NABUMETONE 500 MG PO TABS: 500.0000 mg | ORAL_TABLET | Freq: Two times a day (BID) | ORAL | Status: DC | PRN

## 2015-05-25 NOTE — Progress Notes (Deleted)
Subjective:  This chart was scribed for Stacey Cheadle, MD by Leandra Kern, Medical Scribe. This patient was seen in Room 1 and the patient's care was started at 3:00 PM.   Patient ID: Stacey Mason, female    DOB: Nov 29, 1960, 54 y.o.   MRN: [id number]  Chief Complaint  Patient presents with   Back Pain    x 6 months   Leg Pain    right    HPI HPI Comments: Stacey Mason is a 54 y.o. female who presents to [facility] complaining of back and right leg pain.    I saw the pt for the same problem over a year ago, right sided back pain with sciatica. She was treated with prednisone, taper, meloxicam, and as needed flexeril. Pt had a right hip X-ray 6 months ago that was normal. 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 She used Ibuprofen without relief, and did not recall if the meloxicam or the flexeril have helped either. She was advised to retry the meloxicam with heat along with exercises. The pt was noted at her PCP to have a mild elevation her platelets of 500, and she also her levothyroxine increased. She was recommended to have her lipid panel to be rechecked after her medications were adjusts due to it being worsening. She was noted to be developing pre-diabetes, as well as A1C of 5.9.   Today, pt reports that her pain is worsening since her last visit. She reports having symptoms of mild diarrhea, mild abdominal pain, and occasional dysuria, numbness and tingling sensation in her legs. She states that the pain is more severe when standing up, especially when at work. She denies symptoms of nausea, vomiting, change in appetite. Pt notes that she does not take any OTC medications. She reports that she has been taking Ibuprofen for the pain BID for about 3 months now.  Pt was not compliant with her prescribed meloxicam. She also states that the exercises that she was advised to do did give her  relief.    Patient Active Problem List   Diagnosis Date Noted   HTN (hypertension) 02/26/2013   Hypothyroidism 02/26/2013   Hyperglycemia 02/26/2013   Hyperlipidemia 02/26/2013   Past Medical History  Diagnosis Date   Hypertension    Thyroid disease    Hyperlipidemia    Cardiac murmur    Past Surgical History  Procedure Laterality Date   Breast surgery     No Known Allergies Prior to Admission medications   Medication Sig Start Date End Date Taking? Authorizing Provider  levothyroxine (SYNTHROID, LEVOTHROID) 100 MCG tablet Take 1 tablet (100 mcg total) by mouth daily. For travel to [place]. 02/21/15  Yes Tereasa Coop, PA-C  lisinopril-hydrochlorothiazide (PRINZIDE,ZESTORETIC) 10-12.5 MG per tablet Take 1 tablet by mouth  daily 04/18/15  Yes Tereasa Coop, PA-C   Social History   Social History   Marital Status: Single    Spouse Name: N/A   Number of Children: N/A   Years of Education: N/A   Occupational History   Not on file.   Social History Main Topics   Smoking status: Never Smoker    Smokeless tobacco: Not on file   Alcohol Use: No   Drug Use: No   Sexual Activity: No   Other Topics Concern   Not on file   Social History Narrative   From [place].  In the [place] since 2003.    Review of Systems  Constitutional: Negative for appetite change.  Gastrointestinal: Positive for abdominal pain and diarrhea. Negative for nausea and vomiting.  Genitourinary: Positive for dysuria.  Musculoskeletal: Positive for myalgias.  Neurological: Positive for numbness.      Objective:   Physical Exam  Constitutional: She is oriented to person, place, and time. She appears well-developed and well-nourished. No distress.  HENT:  Head: Normocephalic and atraumatic.  Eyes: EOM are normal. Pupils are equal, round, and reactive to light.  Neck: Neck supple.  Cardiovascular: Normal rate.   Pulses:      Dorsalis pedis pulses are 2+ on the right side, and 2+ on  the left side.       Posterior tibial pulses are 2+ on the right side, and 2+ on the left side.  Pulmonary/Chest: Effort normal.  Abdominal: Soft. She exhibits no distension. There is tenderness (Mild, generalized. ). There is no rebound and no guarding.  Hypoactive bowel sounds.   Musculoskeletal: She exhibits no edema (on lower extremities. ).  Positive straight leg raise on right.  Full range of motion of hip.  No boney tenderness over lumbar spine.  Tender to palpation over upper sacrum Para spinal.  Pain over right SI joint.    Neurological: She is alert and oriented to person, place, and time. No cranial nerve deficit.  Reflex Scores:      Patellar reflexes are 2+ on the right side and 2+ on the left side.      Achilles reflexes are 2+ on the right side and 2+ on the left side. Skin: Skin is warm and dry.  Psychiatric: She has a normal mood and affect. Her behavior is normal.  Nursing note and vitals reviewed.  BP 122/80 mmHg   Pulse 79   Temp(Src) 98.4 F (36.9 C) (Oral)   Resp 16   Ht 5\' 5"  (1.651 m)   Wt 184 lb 3.2 oz (83.553 kg)   BMI 30.65 kg/m2   SpO2 99%  She has failed meloxicam, naproxen, oxycodone, and flexeril.     [facility] reading (PRIMARY) by  Dr. Brigitte Pulse. 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 Right-sided low back pain with right-sided sciatica   2. Essential hypertension   3. Hypothyroidism, unspecified hypothyroidism type   4. Hyperglycemia   5. Hyperlipidemia   6. Polypharmacy   7. Thrombocytosis   8. Constipation, unspecified constipation type   9. Hematuria, microscopic   10. Abdominal bloating   11. Diarrhea   12. Medication refill     Orders Placed This Encounter  Procedures   DG Lumbar Spine 2-3 Views    Standing Status: Future     Number of Occurrences: 1     Standing Expiration Date: 05/24/2016    Order Specific Question:   Reason for Exam (SYMPTOM  OR DIAGNOSIS REQUIRED)    Answer:  worsening right low lumbar/upper sacral pain wiht right sciatica    Order Specific Question:  Is the patient pregnant?    Answer:  No    Order Specific Question:  Preferred imaging location?    Answer:  External   MR Lumbar Spine Wo Contrast    185 LBS/PT IS CLAUS/5'5"/NO NEEDS/NO BACK SURGERY/NO BRAIN, EYE, EAR, OR OPEN HEART SURGERY/PT HAS HTN/NOT DIABETIC/NO LIVER/KIDNEY DISEASE/NO RA, LUPUS, OR SCLERODERMA/NO EXPOSURE TO METAL/NO IMPLANTS OR STENTS/NO BULLET  WOUNDS OR SHRAPNEL/INS/[facility]HC/AW/PT W/EPIC ORDER    Standing Status: Future     Number of Occurrences:      Standing Expiration Date: 07/24/2016    Order Specific Question:  Reason for Exam (SYMPTOM  OR DIAGNOSIS REQUIRED)    Answer:  worsening low back pain wiht right sciatica wiht worsening weakness and numbness of rt leg    Order Specific Question:  Preferred imaging location?    Answer:  [facility]    Order Specific Question:  Does the patient have a pacemaker or implanted devices?    Answer:  No    Order Specific Question:  What is the patient's sedation requirement?    Answer:  No Sedation   TSH   Ambulatory referral to Physical Therapy    Referral Priority:  Routine    Referral Type:  Physical Medicine    Referral Reason:  Specialty Services Required    Requested Specialty:  Physical Therapy    Number of Visits Requested:  1   POCT CBC   POCT SEDIMENTATION RATE   POCT UA - Microscopic Only   POCT urinalysis dipstick    Meds ordered this encounter  Medications   methylPREDNISolone (MEDROL DOSEPAK) 4 MG TBPK tablet    Sig: Use as instructed 6-5-4-3-2-1    Dispense:  21 tablet    Refill:  0   nabumetone (RELAFEN) 500 MG tablet    Sig: Take 1-2 tablets (500-1,000 mg total) by mouth 2 (two) times daily as needed for moderate pain.    Dispense:  40 tablet    Refill:  1    I personally performed the services described in this documentation,  which was scribed in my presence. The recorded information has been reviewed and considered, and addended by me as needed.  Stacey Cheadle, MD MPH

## 2015-05-25 NOTE — Patient Instructions (Addendum)
Start taking a magnesium 400u supplement every evening for bed.  Start on a fiber supplement during the day - like fiber gummies, benefiber, metamucil. If your stools become to loose to cause diarrhea, stop the magnesium.  I recommend keeping the nabumetone on board - esp taking before and during work. Apply heat for 15 minutes to your low back followed by gentle stretching. Try to do this regiment 2 - 3 times a day. .If your pain, weakness, or numbness from the sciatica worsens at all, then take the nabumetone.  Sciatica with Rehab The sciatic nerve runs from the back down the leg and is responsible for sensation and control of the muscles in the back (posterior) side of the thigh, lower leg, and foot. Sciatica is a condition that is characterized by inflammation of this nerve.  SYMPTOMS   Signs of nerve damage, including numbness and/or weakness along the posterior side of the lower extremity.  Pain in the back of the thigh that may also travel down the leg.  Pain that worsens when sitting for long periods of time.  Occasionally, pain in the back or buttock. CAUSES  Inflammation of the sciatic nerve is the cause of sciatica. The inflammation is due to something irritating the nerve. Common sources of irritation include:  Sitting for long periods of time.  Direct trauma to the nerve.  Arthritis of the spine.  Herniated or ruptured disk.  Slipping of the vertebrae (spondylolisthesis).  Pressure from soft tissues, such as muscles or ligament-like tissue (fascia). RISK INCREASES WITH:  Sports that place pressure or stress on the spine (football or weightlifting).  Poor strength and flexibility.  Failure to warm up properly before activity.  Family history of low back pain or disk disorders.  Previous back injury or surgery.  Poor body mechanics, especially when lifting, or poor posture. PREVENTION   Warm up and stretch properly before activity.  Maintain physical  fitness:  Strength, flexibility, and endurance.  Cardiovascular fitness.  Learn and use proper technique, especially with posture and lifting. When possible, have coach correct improper technique.  Avoid activities that place stress on the spine. PROGNOSIS If treated properly, then sciatica usually resolves within 6 weeks. However, occasionally surgery is necessary.  RELATED COMPLICATIONS   Permanent nerve damage, including pain, numbness, tingle, or weakness.  Chronic back pain.  Risks of surgery: infection, bleeding, nerve damage, or damage to surrounding tissues. TREATMENT Treatment initially involves resting from any activities that aggravate your symptoms. The use of ice and medication may help reduce pain and inflammation. The use of strengthening and stretching exercises may help reduce pain with activity. These exercises may be performed at home or with referral to a therapist. A therapist may recommend further treatments, such as transcutaneous electronic nerve stimulation (TENS) or ultrasound. Your caregiver may recommend corticosteroid injections to help reduce inflammation of the sciatic nerve. If symptoms persist despite non-surgical (conservative) treatment, then surgery may be recommended. MEDICATION  If pain medication is necessary, then nonsteroidal anti-inflammatory medications, such as aspirin and ibuprofen, or other minor pain relievers, such as acetaminophen, are often recommended.  Do not take pain medication for 7 days before surgery.  Prescription pain relievers may be given if deemed necessary by your caregiver. Use only as directed and only as much as you need.  Ointments applied to the skin may be helpful.  Corticosteroid injections may be given by your caregiver. These injections should be reserved for the most serious cases, because they may only be given  a certain number of times. HEAT AND COLD  Cold treatment (icing) relieves pain and reduces  inflammation. Cold treatment should be applied for 10 to 15 minutes every 2 to 3 hours for inflammation and pain and immediately after any activity that aggravates your symptoms. Use ice packs or massage the area with a piece of ice (ice massage).  Heat treatment may be used prior to performing the stretching and strengthening activities prescribed by your caregiver, physical therapist, or athletic trainer. Use a heat pack or soak the injury in warm water. SEEK MEDICAL CARE IF:  Treatment seems to offer no benefit, or the condition worsens.  Any medications produce adverse side effects. EXERCISES  RANGE OF MOTION (ROM) AND STRETCHING EXERCISES - Sciatica Most people with sciatic will find that their symptoms worsen with either excessive bending forward (flexion) or arching at the low back (extension). The exercises which will help resolve your symptoms will focus on the opposite motion. Your physician, physical therapist or athletic trainer will help you determine which exercises will be most helpful to resolve your low back pain. Do not complete any exercises without first consulting with your clinician. Discontinue any exercises which worsen your symptoms until you speak to your clinician. If you have pain, numbness or tingling which travels down into your buttocks, leg or foot, the goal of the therapy is for these symptoms to move closer to your back and eventually resolve. Occasionally, these leg symptoms will get better, but your low back pain may worsen; this is typically an indication of progress in your rehabilitation. Be certain to be very alert to any changes in your symptoms and the activities in which you participated in the 24 hours prior to the change. Sharing this information with your clinician will allow him/her to most efficiently treat your condition. These exercises may help you when beginning to rehabilitate your injury. Your symptoms may resolve with or without further involvement  from your physician, physical therapist or athletic trainer. While completing these exercises, remember:   Restoring tissue flexibility helps normal motion to return to the joints. This allows healthier, less painful movement and activity.  An effective stretch should be held for at least 30 seconds.  A stretch should never be painful. You should only feel a gentle lengthening or release in the stretched tissue. FLEXION RANGE OF MOTION AND STRETCHING EXERCISES: STRETCH - Flexion, Single Knee to Chest   Lie on a firm bed or floor with both legs extended in front of you.  Keeping one leg in contact with the floor, bring your opposite knee to your chest. Hold your leg in place by either grabbing behind your thigh or at your knee.  Pull until you feel a gentle stretch in your low back. Hold __________ seconds.  Slowly release your grasp and repeat the exercise with the opposite side. Repeat __________ times. Complete this exercise __________ times per day.  STRETCH - Flexion, Double Knee to Chest  Lie on a firm bed or floor with both legs extended in front of you.  Keeping one leg in contact with the floor, bring your opposite knee to your chest.  Tense your stomach muscles to support your back and then lift your other knee to your chest. Hold your legs in place by either grabbing behind your thighs or at your knees.  Pull both knees toward your chest until you feel a gentle stretch in your low back. Hold __________ seconds.  Tense your stomach muscles and slowly return one  leg at a time to the floor. Repeat __________ times. Complete this exercise __________ times per day.  STRETCH - Low Trunk Rotation   Lie on a firm bed or floor. Keeping your legs in front of you, bend your knees so they are both pointed toward the ceiling and your feet are flat on the floor.  Extend your arms out to the side. This will stabilize your upper body by keeping your shoulders in contact with the  floor.  Gently and slowly drop both knees together to one side until you feel a gentle stretch in your low back. Hold for __________ seconds.  Tense your stomach muscles to support your low back as you bring your knees back to the starting position. Repeat the exercise to the other side. Repeat __________ times. Complete this exercise __________ times per day  EXTENSION RANGE OF MOTION AND FLEXIBILITY EXERCISES: STRETCH - Extension, Prone on Elbows  Lie on your stomach on the floor, a bed will be too soft. Place your palms about shoulder width apart and at the height of your head.  Place your elbows under your shoulders. If this is too painful, stack pillows under your chest.  Allow your body to relax so that your hips drop lower and make contact more completely with the floor.  Hold this position for __________ seconds.  Slowly return to lying flat on the floor. Repeat __________ times. Complete this exercise __________ times per day.  RANGE OF MOTION - Extension, Prone Press Ups  Lie on your stomach on the floor, a bed will be too soft. Place your palms about shoulder width apart and at the height of your head.  Keeping your back as relaxed as possible, slowly straighten your elbows while keeping your hips on the floor. You may adjust the placement of your hands to maximize your comfort. As you gain motion, your hands will come more underneath your shoulders.  Hold this position __________ seconds.  Slowly return to lying flat on the floor. Repeat __________ times. Complete this exercise __________ times per day.  STRENGTHENING EXERCISES - Sciatica  These exercises may help you when beginning to rehabilitate your injury. These exercises should be done near your "sweet spot." This is the neutral, low-back arch, somewhere between fully rounded and fully arched, that is your least painful position. When performed in this safe range of motion, these exercises can be used for people who  have either a flexion or extension based injury. These exercises may resolve your symptoms with or without further involvement from your physician, physical therapist or athletic trainer. While completing these exercises, remember:   Muscles can gain both the endurance and the strength needed for everyday activities through controlled exercises.  Complete these exercises as instructed by your physician, physical therapist or athletic trainer. Progress with the resistance and repetition exercises only as your caregiver advises.  You may experience muscle soreness or fatigue, but the pain or discomfort you are trying to eliminate should never worsen during these exercises. If this pain does worsen, stop and make certain you are following the directions exactly. If the pain is still present after adjustments, discontinue the exercise until you can discuss the trouble with your clinician. STRENGTHENING - Deep Abdominals, Pelvic Tilt   Lie on a firm bed or floor. Keeping your legs in front of you, bend your knees so they are both pointed toward the ceiling and your feet are flat on the floor.  Tense your lower abdominal muscles to press  your low back into the floor. This motion will rotate your pelvis so that your tail bone is scooping upwards rather than pointing at your feet or into the floor.  With a gentle tension and even breathing, hold this position for __________ seconds. Repeat __________ times. Complete this exercise __________ times per day.  STRENGTHENING - Abdominals, Crunches   Lie on a firm bed or floor. Keeping your legs in front of you, bend your knees so they are both pointed toward the ceiling and your feet are flat on the floor. Cross your arms over your chest.  Slightly tip your chin down without bending your neck.  Tense your abdominals and slowly lift your trunk high enough to just clear your shoulder blades. Lifting higher can put excessive stress on the low back and does not  further strengthen your abdominal muscles.  Control your return to the starting position. Repeat __________ times. Complete this exercise __________ times per day.  STRENGTHENING - Quadruped, Opposite UE/LE Lift  Assume a hands and knees position on a firm surface. Keep your hands under your shoulders and your knees under your hips. You may place padding under your knees for comfort.  Find your neutral spine and gently tense your abdominal muscles so that you can maintain this position. Your shoulders and hips should form a rectangle that is parallel with the floor and is not twisted.  Keeping your trunk steady, lift your right hand no higher than your shoulder and then your left leg no higher than your hip. Make sure you are not holding your breath. Hold this position __________ seconds.  Continuing to keep your abdominal muscles tense and your back steady, slowly return to your starting position. Repeat with the opposite arm and leg. Repeat __________ times. Complete this exercise __________ times per day.  STRENGTHENING - Abdominals and Quadriceps, Straight Leg Raise   Lie on a firm bed or floor with both legs extended in front of you.  Keeping one leg in contact with the floor, bend the other knee so that your foot can rest flat on the floor.  Find your neutral spine, and tense your abdominal muscles to maintain your spinal position throughout the exercise.  Slowly lift your straight leg off the floor about 6 inches for a count of 15, making sure to not hold your breath.  Still keeping your neutral spine, slowly lower your leg all the way to the floor. Repeat this exercise with each leg __________ times. Complete this exercise __________ times per day. POSTURE AND BODY MECHANICS CONSIDERATIONS - Sciatica Keeping correct posture when sitting, standing or completing your activities will reduce the stress put on different body tissues, allowing injured tissues a chance to heal and  limiting painful experiences. The following are general guidelines for improved posture. Your physician or physical therapist will provide you with any instructions specific to your needs. While reading these guidelines, remember:  The exercises prescribed by your provider will help you have the flexibility and strength to maintain correct postures.  The correct posture provides the optimal environment for your joints to work. All of your joints have less wear and tear when properly supported by a spine with good posture. This means you will experience a healthier, less painful body.  Correct posture must be practiced with all of your activities, especially prolonged sitting and standing. Correct posture is as important when doing repetitive low-stress activities (typing) as it is when doing a single heavy-load activity (lifting). RESTING POSITIONS Consider which  positions are most painful for you when choosing a resting position. If you have pain with flexion-based activities (sitting, bending, stooping, squatting), choose a position that allows you to rest in a less flexed posture. You would want to avoid curling into a fetal position on your side. If your pain worsens with extension-based activities (prolonged standing, working overhead), avoid resting in an extended position such as sleeping on your stomach. Most people will find more comfort when they rest with their spine in a more neutral position, neither too rounded nor too arched. Lying on a non-sagging bed on your side with a pillow between your knees, or on your back with a pillow under your knees will often provide some relief. Keep in mind, being in any one position for a prolonged period of time, no matter how correct your posture, can still lead to stiffness. PROPER SITTING POSTURE In order to minimize stress and discomfort on your spine, you must sit with correct posture Sitting with good posture should be effortless for a healthy body.  Returning to good posture is a gradual process. Many people can work toward this most comfortably by using various supports until they have the flexibility and strength to maintain this posture on their own. When sitting with proper posture, your ears will fall over your shoulders and your shoulders will fall over your hips. You should use the back of the chair to support your upper back. Your low back will be in a neutral position, just slightly arched. You may place a small pillow or folded towel at the base of your low back for support.  When working at a desk, create an environment that supports good, upright posture. Without extra support, muscles fatigue and lead to excessive strain on joints and other tissues. Keep these recommendations in mind: CHAIR:   A chair should be able to slide under your desk when your back makes contact with the back of the chair. This allows you to work closely.  The chair's height should allow your eyes to be level with the upper part of your monitor and your hands to be slightly lower than your elbows. BODY POSITION  Your feet should make contact with the floor. If this is not possible, use a foot rest.  Keep your ears over your shoulders. This will reduce stress on your neck and low back. INCORRECT SITTING POSTURES   If you are feeling tired and unable to assume a healthy sitting posture, do not slouch or slump. This puts excessive strain on your back tissues, causing more damage and pain. Healthier options include:  Using more support, like a lumbar pillow.  Switching tasks to something that requires you to be upright or walking.  Talking a brief walk.  Lying down to rest in a neutral-spine position. PROLONGED STANDING WHILE SLIGHTLY LEANING FORWARD  When completing a task that requires you to lean forward while standing in one place for a long time, place either foot up on a stationary 2-4 inch high object to help maintain the best posture. When both  feet are on the ground, the low back tends to lose its slight inward curve. If this curve flattens (or becomes too large), then the back and your other joints will experience too much stress, fatigue more quickly and can cause pain.  CORRECT STANDING POSTURES Proper standing posture should be assumed with all daily activities, even if they only take a few moments, like when brushing your teeth. As in sitting, your ears should  fall over your shoulders and your shoulders should fall over your hips. You should keep a slight tension in your abdominal muscles to brace your spine. Your tailbone should point down to the ground, not behind your body, resulting in an over-extended swayback posture.  INCORRECT STANDING POSTURES  Common incorrect standing postures include a forward head, locked knees and/or an excessive swayback. WALKING Walk with an upright posture. Your ears, shoulders and hips should all line-up. PROLONGED ACTIVITY IN A FLEXED POSITION When completing a task that requires you to bend forward at your waist or lean over a low surface, try to find a way to stabilize 3 of 4 of your limbs. You can place a hand or elbow on your thigh or rest a knee on the surface you are reaching across. This will provide you more stability so that your muscles do not fatigue as quickly. By keeping your knees relaxed, or slightly bent, you will also reduce stress across your low back. CORRECT LIFTING TECHNIQUES DO :   Assume a wide stance. This will provide you more stability and the opportunity to get as close as possible to the object which you are lifting.  Tense your abdominals to brace your spine; then bend at the knees and hips. Keeping your back locked in a neutral-spine position, lift using your leg muscles. Lift with your legs, keeping your back straight.  Test the weight of unknown objects before attempting to lift them.  Try to keep your elbows locked down at your sides in order get the best strength  from your shoulders when carrying an object.  Always ask for help when lifting heavy or awkward objects. INCORRECT LIFTING TECHNIQUES DO NOT:   Lock your knees when lifting, even if it is a small object.  Bend and twist. Pivot at your feet or move your feet when needing to change directions.  Assume that you cannot safely pick up a paperclip without proper posture. Document Released: 09/30/2005 Document Revised: 02/14/2014 Document Reviewed: 01/12/2009 [facility] Patient Information 2015 [place], [place]. This information is not intended to replace advice given to you by your health care provider. Make sure you discuss any questions you have with your health care provider.

## 2015-05-26 LAB — TSH: TSH: 3.821 u[IU]/mL (ref 0.350–4.500)

## 2015-06-05 ENCOUNTER — Ambulatory Visit: Payer: 59 | Admitting: Family Medicine

## 2015-06-11 ENCOUNTER — Encounter: Payer: Self-pay | Admitting: Family Medicine

## 2015-06-11 MED ORDER — LISINOPRIL-HYDROCHLOROTHIAZIDE 10-12.5 MG PO TABS: 1.0000 | ORAL_TABLET | Freq: Every day | ORAL | Status: DC

## 2015-06-11 MED ORDER — LEVOTHYROXINE SODIUM 100 MCG PO TABS: 100.0000 ug | ORAL_TABLET | Freq: Every day | ORAL | Status: DC

## 2015-06-11 NOTE — Progress Notes (Signed)
Subjective:  This chart was scribed for [name], MD by Leandra Kern, Medical Scribe. This patient was seen in Room 1 and the patient's care was started at 3:00 PM.   Patient ID: Stacey Mason, female    DOB: Mar 30, 1961, 54 y.o.   MRN: [id number]  Chief Complaint  Patient presents with  . Back Pain    x 6 months  . Leg Pain    right    Back Pain Associated symptoms include abdominal pain, dysuria, leg pain and numbness. Pertinent negatives include no weakness.  Leg Pain  Associated symptoms include numbness.   HPI Comments: Stacey Mason is a 54 y.o. female who presents to [facility] complaining of back and right leg pain.    I saw the pt for the same problem over a year ago, right sided back pain with sciatica. She was treated with prednisone, taper, meloxicam, and as needed flexeril. Pt had a right hip X-ray 6 months ago that was normal. 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 She used Ibuprofen without relief, and did not recall if the meloxicam or the flexeril have helped either. She was advised to retry the meloxicam with heat along with exercises. The pt was noted at her PCP to have a mild elevation her platelets of 500, and she also her levothyroxine increased. She was recommended to have her lipid panel to be rechecked after her medications were adjusts due to it being worsening. She was noted to be developing pre-diabetes, as well as A1C of 5.9.   Today, pt reports that her pain is worsening since her last visit. She reports having symptoms of mild diarrhea, mild abdominal pain, and occasional dysuria, numbness and tingling sensation in her legs. She states that the pain is more severe when standing up, especially when at work. She denies symptoms of nausea, vomiting, change in appetite.  She reports that she has been taking Ibuprofen for the pain BID for about 3 months now but  no other OTC meds.  Pt was not compliant with her prescribed meloxicam. She also states that the exercises that she was advised to do did give her relief.    Patient Active Problem List   Diagnosis Date Noted  . HTN (hypertension) 02/26/2013  . Hypothyroidism 02/26/2013  . Hyperglycemia 02/26/2013  . Hyperlipidemia 02/26/2013   Past Medical History  Diagnosis Date  . Hypertension   . Thyroid disease   . Hyperlipidemia   . Cardiac murmur    Past Surgical History  Procedure Laterality Date  . Breast surgery     No Known Allergies Prior to Admission medications   Medication Sig Start Date End Date Taking? Authorizing Provider  levothyroxine (SYNTHROID, LEVOTHROID) 100 MCG tablet Take 1 tablet (100 mcg total) by mouth daily. For travel to [place]. 02/21/15  Yes Tereasa Coop, PA-C  lisinopril-hydrochlorothiazide (PRINZIDE,ZESTORETIC) 10-12.5 MG per tablet Take 1 tablet by mouth  daily 04/18/15  Yes Tereasa Coop, PA-C   Social History   Social History  . Marital Status: Single    Spouse Name: N/A  . Number of Children: N/A  . Years of Education: N/A   Occupational History  . Not on file.   Social History Main Topics  . Smoking status: Never Smoker   . Smokeless tobacco: Not on file  . Alcohol Use: No  . Drug Use: No  .  Sexual Activity: No   Other Topics Concern  . Not on file   Social History Narrative   From [place].  In the [place] since 2003.    Review of Systems  Constitutional: Positive for activity change and fatigue. Negative for appetite change.  Cardiovascular: Positive for leg swelling.  Gastrointestinal: Positive for abdominal pain, diarrhea and abdominal distention. Negative for nausea, vomiting, constipation, blood in stool and anal bleeding.  Endocrine: Positive for polyphagia.  Genitourinary: Positive for dysuria and hematuria.  Musculoskeletal: Positive for myalgias, back pain, joint swelling, arthralgias and gait problem.  Skin: Negative for rash.    Neurological: Positive for numbness. Negative for weakness.  Hematological: Negative for adenopathy.  Psychiatric/Behavioral: Positive for sleep disturbance.      Objective:   Physical Exam  Constitutional: She is oriented to person, place, and time. She appears well-developed and well-nourished. No distress.  HENT:  Head: Normocephalic and atraumatic.  Eyes: EOM are normal. Pupils are equal, round, and reactive to light.  Neck: Neck supple.  Cardiovascular: Normal rate.   Pulses:      Dorsalis pedis pulses are 2+ on the right side, and 2+ on the left side.       Posterior tibial pulses are 2+ on the right side, and 2+ on the left side.  Pulmonary/Chest: Effort normal.  Abdominal: Soft. She exhibits no distension. There is tenderness (Mild, generalized. ). There is no rebound and no guarding.  Hypoactive bowel sounds.   Musculoskeletal: She exhibits no edema (on lower extremities. ).  Positive straight leg raise on right.  Full range of motion of hip.  No boney tenderness over lumbar spine.  Tender to palpation over upper sacrum Para spinal.  Pain over right SI joint.    Neurological: She is alert and oriented to person, place, and time. No cranial nerve deficit.  Reflex Scores:      Patellar reflexes are 2+ on the right side and 2+ on the left side.      Achilles reflexes are 2+ on the right side and 2+ on the left side. Skin: Skin is warm and dry.  Psychiatric: She has a normal mood and affect. Her behavior is normal.  Nursing note and vitals reviewed.  BP 122/80 mmHg  Pulse 79  Temp(Src) 98.4 F (36.9 C) (Oral)  Resp 16  Ht 5\' 5"  (1.651 m)  Wt 184 lb 3.2 oz (83.553 kg)  BMI 30.65 kg/m2  SpO2 99%     [facility] reading (PRIMARY) by  Dr. Brigitte Pulse. L-spine: loss of nml lumbar lordosis, spondylosis of L5 with degeneration and arthritis at posterior L5-S1 disc, moderate stool  Dg Lumbar Spine 2-3 Views  05/25/2015   CLINICAL DATA:  Chronic low back pain with intermittent  numbness and tingling in both legs.  EXAM: LUMBAR SPINE - 2-3 VIEW  COMPARISON:  Plain films lumbar spine 06/08/2008.  FINDINGS: Mild convex right curvature is identified. Mild endplate sclerosis and anterior endplate spur are seen at L2-3 and L5-S1. Paraspinous structures demonstrate a moderate to moderately large stool burden.  IMPRESSION: No acute abnormality.  Mild convex right curvature and degenerative change.   Electronically Signed   By: Inge Rise M.D.   On: 05/25/2015 16:54     Assessment & Plan:    1. Right-sided low back pain with right-sided sciatica - She has failed meloxicam, naproxen, oxycodone, and flexeril.  Xray shows that her pain and sciatica are likely due to her mild underlying scoliosis as well as DDD/OA so refer  to PT for more definitive trx.  Try Medrol dosepack then transition to relafen. Pt reminded not use these with any other otc pain medication other than tylenol/acetaminophen - so no aleve, ibuprofen, motrin, advil, etc.   2. Essential hypertension - refilled lisinopril-hctz 10-12.5 qd x 1 yr, well controlled  3. Hypothyroidism, unspecified hypothyroidism type - levothyroxine increased to 151mcg 6 mos prior, recheck - tsh nml so refilled and recheck in 6 moa  4. Hyperglycemia   5. Hyperlipidemia   6. Polypharmacy   7. Thrombocytosis   8. Constipation, unspecified constipation type - start fiber supp or miralax w/ prn senokot S  9. Hematuria, microscopic   10. Abdominal bloating   11. Diarrhea   12. Medication refill     Orders Placed This Encounter  Procedures  . DG Lumbar Spine 2-3 Views    Standing Status: Future     Number of Occurrences: 1     Standing Expiration Date: 05/24/2016    Order Specific Question:  Reason for Exam (SYMPTOM  OR DIAGNOSIS REQUIRED)    Answer:  worsening right low lumbar/upper sacral pain wiht right sciatica    Order Specific Question:  Is the patient pregnant?    Answer:  No    Order Specific Question:  Preferred  imaging location?    Answer:  External  . MR Lumbar Spine Wo Contrast    185 LBS/PT IS CLAUS/5'5"/NO NEEDS/NO BACK SURGERY/NO BRAIN, EYE, EAR, OR OPEN HEART SURGERY/PT HAS HTN/NOT DIABETIC/NO LIVER/KIDNEY DISEASE/NO RA, LUPUS, OR SCLERODERMA/NO EXPOSURE TO METAL/NO IMPLANTS OR STENTS/NO BULLET WOUNDS OR SHRAPNEL/INS/UHC/AW/PT W/EPIC ORDER    Standing Status: Future     Number of Occurrences:      Standing Expiration Date: 07/24/2016    Order Specific Question:  Reason for Exam (SYMPTOM  OR DIAGNOSIS REQUIRED)    Answer:  worsening low back pain wiht right sciatica wiht worsening weakness and numbness of rt leg    Order Specific Question:  Preferred imaging location?    Answer:  [facility]    Order Specific Question:  Does the patient have a pacemaker or implanted devices?    Answer:  No    Order Specific Question:  What is the patient's sedation requirement?    Answer:  No Sedation  . TSH  . Ambulatory referral to Physical Therapy    Referral Priority:  Routine    Referral Type:  Physical Medicine    Referral Reason:  Specialty Services Required    Requested Specialty:  Physical Therapy    Number of Visits Requested:  1  . POCT CBC  . POCT SEDIMENTATION RATE  . POCT UA - Microscopic Only  . POCT urinalysis dipstick    Meds ordered this encounter  Medications  . methylPREDNISolone (MEDROL DOSEPAK) 4 MG TBPK tablet    Sig: Use as instructed 6-5-4-3-2-1    Dispense:  21 tablet    Refill:  0  . nabumetone (RELAFEN) 500 MG tablet    Sig: Take 1-2 tablets (500-1,000 mg total) by mouth 2 (two) times daily as needed for moderate pain.    Dispense:  40 tablet    Refill:  1    I personally performed the services described in this documentation, which was scribed in my presence. The recorded information has been reviewed and considered, and addended by me as needed.  [name], MD MPH  Results for orders placed or performed in visit on 05/25/15  TSH  Result Value Ref Range  TSH 3.821 0.350 - 4.500 uIU/mL  POCT CBC  Result Value Ref Range   WBC 7.9 4.6 - 10.2 K/uL   Lymph, poc 4.1 (A) 0.6 - 3.4   POC LYMPH PERCENT 52.4 (A) 10 - 50 %L   MID (cbc) 0.6 0 - 0.9   POC MID % 7.2 0 - 12 %M   POC Granulocyte 3.2 2 - 6.9   Granulocyte percent 40.4 37 - 80 %G   RBC 4.61 4.04 - 5.48 M/uL   Hemoglobin 12.0 (A) 12.2 - 16.2 g/dL   HCT, POC 38.6 37.7 - 47.9 %   MCV 83.8 80 - 97 fL   MCH, POC 25.9 (A) 27 - 31.2 pg   MCHC 31.0 (A) 31.8 - 35.4 g/dL   RDW, POC 14.6 %   Platelet Count, POC 496 (A) 142 - 424 K/uL   MPV 7.3 0 - 99.8 fL  POCT SEDIMENTATION RATE  Result Value Ref Range   POCT SED RATE 42 (A) 0 - 22 mm/hr  POCT urinalysis dipstick  Result Value Ref Range   Color, UA yellow    Clarity, UA clear    Glucose, UA neg    Bilirubin, UA neg    Ketones, UA neg    Spec Grav, UA <=1.005    Blood, UA neg    pH, UA 6.0    Protein, UA neg    Urobilinogen, UA 0.2    Nitrite, UA neg    Leukocytes, UA Negative Negative

## 2015-06-16 ENCOUNTER — Other Ambulatory Visit: Payer: Self-pay

## 2015-06-17 ENCOUNTER — Ambulatory Visit
Admission: RE | Admit: 2015-06-17 | Discharge: 2015-06-17 | Disposition: A | Discharge status: Home / Self Care | Payer: Self-pay | Source: Ambulatory Visit | Attending: Family Medicine | Admitting: Family Medicine

## 2015-06-17 DIAGNOSIS — M5441 Lumbago with sciatica, right side: Secondary | ICD-10-CM

## 2015-06-28 ENCOUNTER — Telehealth: Payer: Self-pay

## 2015-06-28 NOTE — Telephone Encounter (Signed)
Pt was scheduled for a MRI on 9/3. She felt claustrophobic and couldn't have the procedure done. They suggested she get in touch with her doctor and get prescribed something to help with this. Please advise at  [phone]

## 2015-06-29 NOTE — Telephone Encounter (Signed)
Please advise 

## 2015-06-30 MED ORDER — ALPRAZOLAM 0.5 MG PO TABS: ORAL_TABLET | ORAL | Status: DC

## 2015-06-30 NOTE — Telephone Encounter (Signed)
Called in rx - added to med list.

## 2015-06-30 NOTE — Telephone Encounter (Signed)
Dr. Brigitte Pulse the pharmacy called back wanting clarification on the sig. I said what you wrote in the previous message. Please advise.

## 2015-06-30 NOTE — Telephone Encounter (Signed)
Ok to call in xanax 0.5 mg take 1-2 tablets 60 min prior to procedure. Can take another 1-2 tablets immediately prior to the procedure if needed. Disp # 15, no refills.  Someone else need to drive her.

## 2015-06-30 NOTE — Telephone Encounter (Signed)
Called pt, left message to call back. Rx called in.

## 2015-07-12 ENCOUNTER — Telehealth: Payer: Self-pay | Admitting: Family Medicine

## 2015-07-12 NOTE — Telephone Encounter (Signed)
Left a message for patient to return call to schedule appointment for mammogram.

## 2015-08-09 ENCOUNTER — Ambulatory Visit
Admission: RE | Admit: 2015-08-09 | Discharge: 2015-08-09 | Disposition: A | Discharge status: Home / Self Care | Payer: 59 | Source: Ambulatory Visit | Attending: Family Medicine | Admitting: Family Medicine

## 2015-08-14 ENCOUNTER — Telehealth: Payer: Self-pay

## 2015-08-14 DIAGNOSIS — M5441 Lumbago with sciatica, right side: Secondary | ICD-10-CM

## 2015-08-14 NOTE — Telephone Encounter (Signed)
Please review MRI 08/09/15

## 2015-08-14 NOTE — Telephone Encounter (Signed)
Patient needs someone to call her with her MRI results she stated she had the scan last week and she hadnt heard anything. Can someone please call her at [phone]

## 2015-08-15 NOTE — Telephone Encounter (Signed)
Has degenerative discs that are pressing on her nerves.  We will get her to neurosurgery as long as she is continuing to have any pain/sxs since she completed steroid course and PT - they can discuss if poss injections are necessary or consider whether pt wants to proceed w/ surg.

## 2015-08-15 NOTE — Telephone Encounter (Signed)
Left message for pt to call back  °

## 2015-08-17 NOTE — Telephone Encounter (Signed)
Pt still in pain. She does want referral to neuro.  She will be in tomorrow to see Dr. Brigitte Pulse.  Referral made to neuro for you already

## 2015-08-17 NOTE — Telephone Encounter (Signed)
Wonderful, thanks!  

## 2015-08-17 NOTE — Addendum Note (Signed)
Addended by: Kem Boroughs D on: 08/17/2015 06:09 PM   Modules accepted: Orders

## 2015-08-18 ENCOUNTER — Ambulatory Visit (INDEPENDENT_AMBULATORY_CARE_PROVIDER_SITE_OTHER): Payer: 59 | Admitting: Family Medicine

## 2015-08-18 VITALS — BP 128/80 | HR 80 | Temp 98.3°F | Resp 16 | Ht 65.5 in | Wt 182.0 lb

## 2015-08-18 DIAGNOSIS — M5431 Sciatica, right side: Secondary | ICD-10-CM | POA: Diagnosis not present

## 2015-08-18 MED ORDER — PREDNISONE 20 MG PO TABS: ORAL_TABLET | ORAL | Status: DC

## 2015-08-18 MED ORDER — TIZANIDINE HCL 6 MG PO CAPS: 6.0000 mg | ORAL_CAPSULE | Freq: Every day | ORAL | Status: DC

## 2015-08-18 MED ORDER — TRAMADOL HCL 50 MG PO TABS: 50.0000 mg | ORAL_TABLET | Freq: Three times a day (TID) | ORAL | Status: DC | PRN

## 2015-08-18 NOTE — Progress Notes (Signed)
Subjective:    Patient ID: Stacey Mason, female    DOB: January 01, 1961, 54 y.o.   MRN: [id number] This chart was scribed for [name], MD by Zola Button, Medical Scribe. This patient was seen in Room 4 and the patient's care was started at 5:01 PM.   Chief Complaint  Patient presents with  . Follow-up    MRI follow up, back pain    HPI HPI Comments: Stacey Mason is a 54 y.o. female who presents to the [facility] for a follow-up for back pain. Patient is still having the back pain, which has not improved. The Medrol Dosepak did not provide relief to her back pain. She would like to try a different medication. She also went through physical therapy which did not help. She states she has been sleeping fine.  Past Medical History  Diagnosis Date  . Hypertension   . Thyroid disease   . Hyperlipidemia   . Cardiac murmur    Current Outpatient Prescriptions on File Prior to Visit  Medication Sig Dispense Refill  . levothyroxine (SYNTHROID, LEVOTHROID) 100 MCG tablet Take 1 tablet (100 mcg total) by mouth daily before breakfast. 90 tablet 1  . lisinopril-hydrochlorothiazide (PRINZIDE,ZESTORETIC) 10-12.5 MG per tablet Take 1 tablet by mouth daily. 90 tablet 3  . ALPRAZolam (XANAX) 0.5 MG tablet Take 1-2 tabs 60 min prior to procedure. Make take another 1-2 tabs immed prior to procedure if needed. (Patient not taking: Reported on 08/18/2015) 15 tablet 0   No current facility-administered medications on file prior to visit.   No Known Allergies   Review of Systems  Constitutional: Positive for activity change and fatigue. Negative for fever, chills, appetite change and unexpected weight change.  Gastrointestinal: Negative for nausea, vomiting, abdominal pain, diarrhea and constipation.  Genitourinary: Negative for urgency, frequency, decreased urine volume and difficulty urinating.  Musculoskeletal: Positive for myalgias, back pain, arthralgias and gait problem. Negative for  joint swelling.  Skin: Negative for rash.  Neurological: Positive for weakness. Negative for numbness.  Hematological: Negative for adenopathy. Does not bruise/bleed easily.  Psychiatric/Behavioral: Negative for sleep disturbance.       Objective:  BP 128/80 mmHg  Pulse 80  Temp(Src) 98.3 F (36.8 C) (Oral)  Resp 16  Ht 5' 5.5" (1.664 m)  Wt 182 lb (82.555 kg)  BMI 29.82 kg/m2  SpO2 94%  Physical Exam  Constitutional: She is oriented to person, place, and time. She appears well-developed and well-nourished. No distress.  HENT:  Head: Normocephalic and atraumatic.  Mouth/Throat: Oropharynx is clear and moist. No oropharyngeal exudate.  Eyes: Pupils are equal, round, and reactive to light.  Neck: Neck supple.  Cardiovascular: Normal rate.   Pulmonary/Chest: Effort normal.  Musculoskeletal: She exhibits no edema.  Neurological: She is alert and oriented to person, place, and time. No cranial nerve deficit.  Skin: Skin is warm and dry. No rash noted.  Psychiatric: She has a normal mood and affect. Her behavior is normal.  Nursing note and vitals reviewed.         Assessment & Plan:   1. Sciatica of right side   Reviewed MRI. Failed PT. Referral neurosurg P. Minimal improvement with prior pred course and sxs still severe so try higher dose.  Meds ordered this encounter  Medications  . predniSONE (DELTASONE) 20 MG tablet    Sig: Take 4 tabs po qd x 2d, 3 tabs po qd x 2d, 2 tabs po qd x 3d, 1 tab po qd  x3d, 1/2 tab po qd x 2d.    Dispense:  24 tablet    Refill:  0  . tizanidine (ZANAFLEX) 6 MG capsule    Sig: Take 1 capsule (6 mg total) by mouth at bedtime.    Dispense:  30 capsule    Refill:  1  . traMADol (ULTRAM) 50 MG tablet    Sig: Take 1 tablet (50 mg total) by mouth every 8 (eight) hours as needed.    Dispense:  30 tablet    Refill:  1    I personally performed the services described in this documentation, which was scribed in my presence. The recorded  information has been reviewed and considered, and addended by me as needed.  [name], MD MPH  Language level caveat. College-aged daughter translated.  By signing my name below, I, Zola Button, attest that this documentation has been prepared under the direction and in the presence of [name], MD.  Electronically Signed: Zola Button, Medical Scribe. 08/18/2015. 5:01 PM.

## 2015-10-10 ENCOUNTER — Telehealth: Payer: Self-pay

## 2015-10-10 NOTE — Telephone Encounter (Signed)
error 

## 2015-11-16 ENCOUNTER — Encounter: Payer: Self-pay | Admitting: Family Medicine

## 2015-11-16 NOTE — Progress Notes (Deleted)
   Subjective:    Patient ID: Stacey Mason, female    DOB: 09/17/1961, 55 y.o.   MRN: [id number]  HPI  Stacey Mason is a 55 yo woman here today for her full physical. She was last seen for CPE 1 year prior by Tor Netters FNP.  TdaP 02/26/2013 Flu shot - refused prior Pap - has never had and has refused prior Mammoram - none prior, referred mult times but never scheduled colonosocpy - referred last year but pt never called back  Weight:  H/o perforated left TM - was planning to have repaired in [place] last yr. 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  She was seen at [facility] by Dr. Kathyrn Sheriff in November after abnormal MRI  Language level - pt's daughter Judeth Cornfield interprets as pt mainly speaks Arabic.  Needs 1 time screening hep C and HIV  Review of Systems     Objective:   Physical Exam        Assessment & Plan:

## 2015-11-17 ENCOUNTER — Telehealth: Payer: Self-pay | Admitting: Family Medicine

## 2015-11-17 NOTE — Telephone Encounter (Signed)
Pt no-showed an appt for a CPE on 11/16/15.  Below are the notes I had made in preparation for her visit. Marland Kitchen She was last seen for CPE 1 year prior by Tor Netters FNP.  TdaP 02/26/2013 Flu shot - refused prior Pap - has never had and has refused prior Mammoram - none prior, referred mult times but never scheduled colonosocpy - referred last year but pt never called back  Weight:  H/o perforated left TM - was planning to have repaired in [place] last yr. 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  She was seen at [facility] by Dr. Kathyrn Sheriff in November after abnormal MRI  Language level - pt's daughter Judeth Cornfield interprets as pt mainly speaks Arabic.  Needs 1 time screening hep C and HIV

## 2015-11-17 NOTE — Progress Notes (Signed)
This encounter was created in error - please disregard.

## 2015-12-04 ENCOUNTER — Other Ambulatory Visit: Payer: Self-pay | Admitting: Physician Assistant

## 2016-03-18 ENCOUNTER — Ambulatory Visit: Payer: 59 | Attending: Internal Medicine

## 2016-07-03 ENCOUNTER — Other Ambulatory Visit: Payer: Self-pay

## 2016-07-03 MED ORDER — LISINOPRIL-HYDROCHLOROTHIAZIDE 10-12.5 MG PO TABS: 1.0000 | ORAL_TABLET | Freq: Every day | ORAL | 0 refills | Status: DC

## 2016-10-17 ENCOUNTER — Emergency Department (HOSPITAL_COMMUNITY): Payer: Self-pay

## 2016-10-17 ENCOUNTER — Emergency Department (HOSPITAL_COMMUNITY)
Admission: EM | Admit: 2016-10-17 | Discharge: 2016-10-17 | Disposition: A | Discharge status: Home / Self Care | Payer: Self-pay | Attending: Emergency Medicine | Admitting: Emergency Medicine

## 2016-10-17 ENCOUNTER — Encounter (HOSPITAL_COMMUNITY): Payer: Self-pay | Admitting: Emergency Medicine

## 2016-10-17 DIAGNOSIS — J09X2 Influenza due to identified novel influenza A virus with other respiratory manifestations: Secondary | ICD-10-CM | POA: Insufficient documentation

## 2016-10-17 DIAGNOSIS — Z760 Encounter for issue of repeat prescription: Secondary | ICD-10-CM

## 2016-10-17 DIAGNOSIS — Z79899 Other long term (current) drug therapy: Secondary | ICD-10-CM | POA: Insufficient documentation

## 2016-10-17 DIAGNOSIS — I10 Essential (primary) hypertension: Secondary | ICD-10-CM | POA: Insufficient documentation

## 2016-10-17 DIAGNOSIS — E039 Hypothyroidism, unspecified: Secondary | ICD-10-CM | POA: Insufficient documentation

## 2016-10-17 DIAGNOSIS — J101 Influenza due to other identified influenza virus with other respiratory manifestations: Secondary | ICD-10-CM

## 2016-10-17 LAB — URINALYSIS, ROUTINE W REFLEX MICROSCOPIC
Bilirubin Urine: NEGATIVE
GLUCOSE, UA: NEGATIVE mg/dL
HGB URINE DIPSTICK: NEGATIVE
Ketones, ur: 5 mg/dL — AB
Leukocytes, UA: NEGATIVE
Nitrite: NEGATIVE
PH: 6 (ref 5.0–8.0)
PROTEIN: NEGATIVE mg/dL
SPECIFIC GRAVITY, URINE: 1.011 (ref 1.005–1.030)

## 2016-10-17 LAB — COMPREHENSIVE METABOLIC PANEL
ALBUMIN: 3.8 g/dL (ref 3.5–5.0)
ALT: 12 U/L — ABNORMAL LOW (ref 14–54)
AST: 16 U/L (ref 15–41)
Alkaline Phosphatase: 111 U/L (ref 38–126)
Anion gap: 10 (ref 5–15)
BUN: 17 mg/dL (ref 6–20)
CHLORIDE: 100 mmol/L — AB (ref 101–111)
CO2: 23 mmol/L (ref 22–32)
Calcium: 8.9 mg/dL (ref 8.9–10.3)
Creatinine, Ser: 0.63 mg/dL (ref 0.44–1.00)
GFR calc Af Amer: >60 mL/min (ref >60)
GFR calc non Af Amer: >60 mL/min (ref >60)
GLUCOSE: 126 mg/dL — AB (ref 65–99)
POTASSIUM: 3.5 mmol/L (ref 3.5–5.1)
Sodium: 133 mmol/L — ABNORMAL LOW (ref 135–145)
Total Bilirubin: 0.4 mg/dL (ref 0.3–1.2)
Total Protein: 7.7 g/dL (ref 6.5–8.1)

## 2016-10-17 LAB — CBC WITH DIFFERENTIAL/PLATELET
Basophils Absolute: 0 10*3/uL (ref 0.0–0.1)
Basophils Relative: 0 %
EOS PCT: 1 %
Eosinophils Absolute: 0.1 10*3/uL (ref 0.0–0.7)
HCT: 35.6 % — ABNORMAL LOW (ref 36.0–46.0)
Hemoglobin: 12.1 g/dL (ref 12.0–15.0)
LYMPHS ABS: 0.8 10*3/uL (ref 0.7–4.0)
LYMPHS PCT: 9 %
MCH: 27.1 pg (ref 26.0–34.0)
MCHC: 34 g/dL (ref 30.0–36.0)
MCV: 79.8 fL (ref 78.0–100.0)
Monocytes Absolute: 1.4 10*3/uL — ABNORMAL HIGH (ref 0.1–1.0)
Monocytes Relative: 16 %
NEUTROS ABS: 6.7 10*3/uL (ref 1.7–7.7)
Neutrophils Relative %: 74 %
PLATELETS: 392 10*3/uL (ref 150–400)
RBC: 4.46 MIL/uL (ref 3.87–5.11)
RDW: 14.4 % (ref 11.5–15.5)
WBC: 9 10*3/uL (ref 4.0–10.5)

## 2016-10-17 LAB — INFLUENZA PANEL BY PCR (TYPE A & B)
Influenza A By PCR: POSITIVE — AB
Influenza B By PCR: NEGATIVE

## 2016-10-17 LAB — I-STAT CG4 LACTIC ACID, ED
LACTIC ACID, VENOUS: 0.78 mmol/L (ref 0.5–1.9)
LACTIC ACID, VENOUS: 0.73 mmol/L (ref 0.5–1.9)

## 2016-10-17 LAB — RAPID STREP SCREEN (MED CTR MEBANE ONLY): STREPTOCOCCUS, GROUP A SCREEN (DIRECT): NEGATIVE

## 2016-10-17 LAB — TSH: TSH: 4.888 u[IU]/mL — AB (ref 0.350–4.500)

## 2016-10-17 MED ORDER — SODIUM CHLORIDE 0.9 % IV BOLUS (SEPSIS)
1000.0000 mL | Freq: Once | INTRAVENOUS | Status: AC
  Administered 2016-10-17: 1000 mL via INTRAVENOUS

## 2016-10-17 MED ORDER — ONDANSETRON HCL 4 MG/2ML IJ SOLN
4.0000 mg | Freq: Once | INTRAMUSCULAR | Status: AC
  Administered 2016-10-17: 4 mg via INTRAVENOUS
  Filled 2016-10-17: qty 2

## 2016-10-17 MED ORDER — LISINOPRIL-HYDROCHLOROTHIAZIDE 10-12.5 MG PO TABS: 1.0000 | ORAL_TABLET | Freq: Every day | ORAL | 1 refills | Status: DC

## 2016-10-17 MED ORDER — HYDROCODONE-ACETAMINOPHEN 5-325 MG PO TABS: ORAL_TABLET | ORAL | 0 refills | Status: DC

## 2016-10-17 MED ORDER — KETOROLAC TROMETHAMINE 30 MG/ML IJ SOLN
30.0000 mg | Freq: Once | INTRAMUSCULAR | Status: AC
  Administered 2016-10-17: 30 mg via INTRAVENOUS
  Filled 2016-10-17: qty 1

## 2016-10-17 MED ORDER — LEVOTHYROXINE SODIUM 100 MCG PO TABS: 100.0000 ug | ORAL_TABLET | Freq: Every day | ORAL | 1 refills | Status: DC

## 2016-10-17 MED ORDER — SODIUM CHLORIDE 0.9 % IV BOLUS (SEPSIS)
500.0000 mL | Freq: Once | INTRAVENOUS | Status: AC
  Administered 2016-10-17: 500 mL via INTRAVENOUS

## 2016-10-17 MED ORDER — MORPHINE SULFATE (PF) 4 MG/ML IV SOLN
4.0000 mg | Freq: Once | INTRAVENOUS | Status: AC
  Administered 2016-10-17: 4 mg via INTRAVENOUS
  Filled 2016-10-17: qty 1

## 2016-10-17 NOTE — Progress Notes (Signed)
56 year old female uninsured in [place] for "fourteen years" per 2 female family members at bedside Pt noted in EPIC to see [name] at some time but pt/family confirms no longer seeing Pt has attempted to get "orange card:" assistance at "the church" without success Confirms use of lisinopril and synthroid but without pcp to assist with prescriptions Cm discussed possible appt to [facility] medical overflow Pt agreed to attempt to get an appt Pt information sent to [place] discussed importance of use of uninsured pcps in [facility], the medication resources like Good[facility] Provided copies of cost for her lisinopril, synthyroid, ultram and zanaflex The synthroid and lisinopril cost $4 at [facility] The tramadol cost $5 and zanaflex cost $10 Spoke with [facility] staff to get pt below appt  Entered in D/c instructions Dorena Dew, [place] [address] [id number]   Next Steps: Go on 10/29/2016  Instructions: You have been provided an appointment to establish uninsured provider services with the [facility] medical overflow nurse pratitioner, L Hollis at 0930 on 10/29/16 Also may assist with orange card

## 2016-10-17 NOTE — ED Triage Notes (Signed)
Patient complaining of headache, cough, and sore throat. Patient states it started last night.

## 2016-10-17 NOTE — ED Notes (Signed)
2 failed attmpt to collect labs 

## 2016-10-17 NOTE — ED Notes (Signed)
Pt been stuck multiple times by 3 staff members for 2nd  Blood culture with no success.

## 2016-10-17 NOTE — Discharge Instructions (Signed)

## 2016-10-17 NOTE — ED Notes (Signed)
Pt ambulated to restroom with steady gait and minimum assistance

## 2016-10-17 NOTE — ED Notes (Signed)
Attempted blood drawing in [facility], was unsuccessful. Rn aware

## 2016-10-17 NOTE — ED Provider Notes (Signed)
[place] DEPT Provider Note   CSN: [id number] Arrival date & time: 10/17/16  0553     History   Chief Complaint Chief Complaint  Patient presents with  . Headache  . Cough  . Sore Throat   HPI   Blood pressure 125/72, pulse 110, temperature 98.8 F (37.1 C), temperature source Oral, resp. rate 18, height 5\' 6"  (1.676 m), weight 79.4 kg, SpO2 95 %.  Stacey Mason is a 56 y.o. female with past medical history significant for hypertension, hyperlipidemia she is also hypothyroid and has been out of her levothyroxine because she can't afford it this is been for multiple months. She developed cough, sore throat, headache, myalgia and diffuse fatigue onset this morning. She denies fevers, chills, chest pain, abdominal pain, emesis, change in bowel or bladder habits. She did not get a flu shot this year. Her husband is sick with upper respiratory infection which started yesterday. Headache is global and diffuse. She denies any focal weakness, dysarthria.  Past Medical History:  Diagnosis Date  . Cardiac murmur   . Hyperlipidemia   . Hypertension   . Thyroid disease     Patient Active Problem List   Diagnosis Date Noted  . HTN (hypertension) 02/26/2013  . Hypothyroidism 02/26/2013  . Hyperglycemia 02/26/2013  . Hyperlipidemia 02/26/2013    Past Surgical History:  Procedure Laterality Date  . BREAST SURGERY      OB History    No data available       Home Medications    Prior to Admission medications   Medication Sig Start Date End Date Taking? Authorizing Provider  ibuprofen (ADVIL,MOTRIN) 200 MG tablet Take 400 mg by mouth every 4 (four) hours as needed for moderate pain.   Yes Historical Provider, MD  ALPRAZolam (XANAX) 0.5 MG tablet Take 1-2 tabs 60 min prior to procedure. Make take another 1-2 tabs immed prior to procedure if needed. Patient not taking: Reported on 08/18/2015 06/30/15   Shawnee Knapp, MD  HYDROcodone-acetaminophen (NORCO/VICODIN) 5-325 MG tablet  Take 1-2 tablets by mouth every 6 hours as needed for pain and/or cough. 10/17/16   Mckaylie Vasey, PA-C  levothyroxine (SYNTHROID, LEVOTHROID) 100 MCG tablet Take 1 tablet (100 mcg total) by mouth daily before breakfast. 10/17/16   Elmyra Ricks Tracina Beaumont, PA-C  lisinopril-hydrochlorothiazide (PRINZIDE,ZESTORETIC) 10-12.5 MG tablet Take 1 tablet by mouth daily. 10/17/16   Lareen Mullings, PA-C  predniSONE (DELTASONE) 20 MG tablet Take 4 tabs po qd x 2d, 3 tabs po qd x 2d, 2 tabs po qd x 3d, 1 tab po qd x3d, 1/2 tab po qd x 2d. Patient not taking: Reported on 10/17/2016 08/18/15   Shawnee Knapp, MD  tizanidine (ZANAFLEX) 6 MG capsule Take 1 capsule (6 mg total) by mouth at bedtime. Patient not taking: Reported on 10/17/2016 08/18/15   Shawnee Knapp, MD  traMADol (ULTRAM) 50 MG tablet Take 1 tablet (50 mg total) by mouth every 8 (eight) hours as needed. Patient not taking: Reported on 10/17/2016 08/18/15   Shawnee Knapp, MD    Family History Family History  Problem Relation Age of Onset  . Diabetes Father   . Hyperlipidemia Father   . Hypertension Father     Social History Social History  Substance Use Topics  . Smoking status: Never Smoker  . Smokeless tobacco: Never Used  . Alcohol use No     Allergies   Patient has no known allergies.   Review of Systems Review of Systems  10 systems reviewed  and found to be negative, except as noted in the HPI.   Physical Exam Updated Vital Signs BP 104/67   Pulse 81   Temp 98.8 F (37.1 C) (Oral)   Resp 16   Ht 5\' 6"  (1.676 m)   Wt 79.4 kg   SpO2 93%   BMI 28.25 kg/m   Physical Exam  Constitutional: She is oriented to person, place, and time. She appears well-developed and well-nourished. No distress.  HENT:  Head: Normocephalic and atraumatic.  Mouth/Throat: Oropharynx is clear and moist.  Eyes: Conjunctivae and EOM are normal. Pupils are equal, round, and reactive to light.  Neck: Normal range of motion.  FROM to C-spine. Pt can touch chin to  chest without discomfort. No TTP of midline cervical spine.   Cardiovascular: Normal rate, regular rhythm and intact distal pulses.  Exam reveals no gallop and no friction rub.   No murmur heard. Pulmonary/Chest: Effort normal and breath sounds normal. No respiratory distress. She has no wheezes. She has no rales. She exhibits no tenderness.  Abdominal: Soft. She exhibits no distension and no mass. There is no tenderness. There is no rebound and no guarding. No hernia.  Musculoskeletal: Normal range of motion.  Neurological: She is alert and oriented to person, place, and time.  Skin: Capillary refill takes less than 2 seconds. She is not diaphoretic.  Psychiatric: She has a normal mood and affect.  Nursing note and vitals reviewed.    ED Treatments / Results  Labs (all labs ordered are listed, but only abnormal results are displayed) Labs Reviewed  COMPREHENSIVE METABOLIC PANEL - Abnormal; Notable for the following:       Result Value   Sodium 133 (*)    Chloride 100 (*)    Glucose, Bld 126 (*)    ALT 12 (*)    All other components within normal limits  CBC WITH DIFFERENTIAL/PLATELET - Abnormal; Notable for the following:    HCT 35.6 (*)    Monocytes Absolute 1.4 (*)    All other components within normal limits  URINALYSIS, ROUTINE W REFLEX MICROSCOPIC - Abnormal; Notable for the following:    Color, Urine STRAW (*)    Ketones, ur 5 (*)    All other components within normal limits  INFLUENZA PANEL BY PCR (TYPE A & B, H1N1) - Abnormal; Notable for the following:    Influenza A By PCR POSITIVE (*)    All other components within normal limits  TSH - Abnormal; Notable for the following:    TSH 4.888 (*)    All other components within normal limits  RAPID STREP SCREEN (NOT AT [facility])  CULTURE, GROUP A STREP ([place])  CULTURE, BLOOD (ROUTINE X 2)  CULTURE, BLOOD (ROUTINE X 2)  URINE CULTURE  I-STAT CG4 LACTIC ACID, ED  I-STAT CG4 LACTIC ACID, ED    EKG  EKG  Interpretation None       Radiology Dg Chest 2 View  Result Date: 10/17/2016 CLINICAL DATA:  Cough and congestion EXAM: CHEST  2 VIEW COMPARISON:  09/27/2005 FINDINGS: Cardiac shadow is stable. Mild interstitial changes are noted bilaterally likely related to a degree of bronchitis. No focal confluent infiltrate is seen. No sizable effusion is noted. No acute bony abnormality is seen. IMPRESSION: Changes most consistent with bronchitis. Electronically Signed   By: Inez Catalina M.D.   On: 10/17/2016 08:14    Procedures Procedures (including critical care time)  Medications Ordered in ED Medications  sodium chloride 0.9 % bolus 1,000  mL (0 mLs Intravenous Stopped 10/17/16 0816)    And  sodium chloride 0.9 % bolus 1,000 mL (0 mLs Intravenous Stopped 10/17/16 0814)    And  sodium chloride 0.9 % bolus 500 mL (0 mLs Intravenous Stopped 10/17/16 0812)  morphine 4 MG/ML injection 4 mg (4 mg Intravenous Given 10/17/16 0749)  ondansetron (ZOFRAN) injection 4 mg (4 mg Intravenous Given 10/17/16 0749)  ketorolac (TORADOL) 30 MG/ML injection 30 mg (30 mg Intravenous Given 10/17/16 1049)     Initial Impression / Assessment and Plan / ED Course  I have reviewed the triage vital signs and the nursing notes.  Pertinent labs & imaging results that were available during my care of the patient were reviewed by me and considered in my medical decision making (see chart for details).  Clinical Course     Vitals:   10/17/16 0621 10/17/16 0856 10/17/16 1102 10/17/16 1303  BP:  129/82 123/70 104/67  Pulse:  96 94 81  Resp:  17 15 16   Temp:      TempSrc:      SpO2:  93% 95% 93%  Weight: 79.4 kg     Height: 5\' 6"  (1.676 m)       Medications  sodium chloride 0.9 % bolus 1,000 mL (0 mLs Intravenous Stopped 10/17/16 0816)    And  sodium chloride 0.9 % bolus 1,000 mL (0 mLs Intravenous Stopped 10/17/16 0814)    And  sodium chloride 0.9 % bolus 500 mL (0 mLs Intravenous Stopped 10/17/16 0812)  morphine 4 MG/ML  injection 4 mg (4 mg Intravenous Given 10/17/16 0749)  ondansetron (ZOFRAN) injection 4 mg (4 mg Intravenous Given 10/17/16 0749)  ketorolac (TORADOL) 30 MG/ML injection 30 mg (30 mg Intravenous Given 10/17/16 1049)    Stacey Mason is 56 y.o. female presenting with URI-like symptoms with associated headache and generalized fatigue. Patient is acutely ill-appearing however afebrile and nontoxic. Lung sounds are clear, chest x-ray without abnormality. Lactic acid is normal. No leukocytosis. Patient given headache cocktail with some relief. She is positive for flu, no comorbidities. No indication to initiate Tamiflu at this point. Her TSH is elevated she is been unable to fill her levothyroxine because she doesn't have insurance. Case management is consulted and we have arranged for primary care for her I will refill both her lisinopril hydrochlorothiazide and her thyroid medication. Vicodin for pain control and I've advised him to push fluids and return to ED for any new or worsening symptoms.  Evaluation does not show pathology that would require ongoing emergent intervention or inpatient treatment. Pt is hemodynamically stable and mentating appropriately. Discussed findings and plan with patient/guardian, who agrees with care plan. All questions answered. Return precautions discussed and outpatient follow up given.   Final Clinical Impressions(s) / ED Diagnoses   Final diagnoses:  Influenza A    New Prescriptions Discharge Medication List as of 10/17/2016  1:09 PM    START taking these medications   Details  HYDROcodone-acetaminophen (NORCO/VICODIN) 5-325 MG tablet Take 1-2 tablets by mouth every 6 hours as needed for pain and/or cough., Print         Monico Blitz, PA-C 10/17/16 [place], MD 10/17/16 [id number]

## 2016-10-18 LAB — URINE CULTURE

## 2016-10-19 LAB — CULTURE, GROUP A STREP (THRC)

## 2016-10-22 LAB — CULTURE, BLOOD (ROUTINE X 2)
CULTURE: NO GROWTH
CULTURE: NO GROWTH

## 2016-10-29 ENCOUNTER — Ambulatory Visit: Payer: 59 | Admitting: Family Medicine

## 2016-11-20 ENCOUNTER — Ambulatory Visit: Payer: 59 | Admitting: Family Medicine

## 2016-12-26 ENCOUNTER — Ambulatory Visit: Payer: 59 | Admitting: Family Medicine

## 2016-12-30 ENCOUNTER — Ambulatory Visit: Payer: 59 | Admitting: Family Medicine

## 2017-01-06 ENCOUNTER — Encounter: Payer: Self-pay | Admitting: Family Medicine

## 2017-01-06 ENCOUNTER — Ambulatory Visit: Payer: 59 | Attending: Family Medicine | Admitting: Family Medicine

## 2017-01-06 VITALS — BP 140/81 | HR 97 | Temp 98.0°F | Resp 18 | Ht 65.0 in | Wt 198.8 lb

## 2017-01-06 DIAGNOSIS — M545 Low back pain: Secondary | ICD-10-CM

## 2017-01-06 DIAGNOSIS — E039 Hypothyroidism, unspecified: Secondary | ICD-10-CM | POA: Diagnosis not present

## 2017-01-06 DIAGNOSIS — Z79899 Other long term (current) drug therapy: Secondary | ICD-10-CM | POA: Insufficient documentation

## 2017-01-06 DIAGNOSIS — G8929 Other chronic pain: Secondary | ICD-10-CM | POA: Insufficient documentation

## 2017-01-06 DIAGNOSIS — L309 Dermatitis, unspecified: Secondary | ICD-10-CM | POA: Diagnosis not present

## 2017-01-06 DIAGNOSIS — I10 Essential (primary) hypertension: Secondary | ICD-10-CM | POA: Diagnosis not present

## 2017-01-06 MED ORDER — TRIAMCINOLONE ACETONIDE 0.025 % EX OINT: 1.0000 "application " | TOPICAL_OINTMENT | Freq: Two times a day (BID) | CUTANEOUS | 0 refills | Status: DC

## 2017-01-06 MED ORDER — IBUPROFEN 800 MG PO TABS: 800.0000 mg | ORAL_TABLET | Freq: Three times a day (TID) | ORAL | 0 refills | Status: DC | PRN

## 2017-01-06 NOTE — Patient Instructions (Addendum)
You will be called with your lab results. Schedule appointment for blood pressure check with nurse. Be sure to take you blood pressure medication prior to visit.   Back Pain, Adult Back pain is very common in adults.The cause of back pain is rarely dangerous and the pain often gets better over time.The cause of your back pain may not be known. Some common causes of back pain include:  Strain of the muscles or ligaments supporting the spine.  Wear and tear (degeneration) of the spinal disks.  Arthritis.  Direct injury to the back. For many people, back pain may return. Since back pain is rarely dangerous, most people can learn to manage this condition on their own. Follow these instructions at home: Watch your back pain for any changes. The following actions may help to lessen any discomfort you are feeling:  Remain active. It is stressful on your back to sit or stand in one place for long periods of time. Do not sit, drive, or stand in one place for more than 30 minutes at a time. Take short walks on even surfaces as soon as you are able.Try to increase the length of time you walk each day.  Exercise regularly as directed by your health care provider. Exercise helps your back heal faster. It also helps avoid future injury by keeping your muscles strong and flexible.  Do not stay in bed.Resting more than 1-2 days can delay your recovery.  Pay attention to your body when you bend and lift. The most comfortable positions are those that put less stress on your recovering back. Always use proper lifting techniques, including:  Bending your knees.  Keeping the load close to your body.  Avoiding twisting.  Find a comfortable position to sleep. Use a firm mattress and lie on your side with your knees slightly bent. If you lie on your back, put a pillow under your knees.  Avoid feeling anxious or stressed.Stress increases muscle tension and can worsen back pain.It is important to  recognize when you are anxious or stressed and learn ways to manage it, such as with exercise.  Take medicines only as directed by your health care provider. Over-the-counter medicines to reduce pain and inflammation are often the most helpful.Your health care provider may prescribe muscle relaxant drugs.These medicines help dull your pain so you can more quickly return to your normal activities and healthy exercise.  Apply ice to the injured area:  Put ice in a plastic bag.  Place a towel between your skin and the bag.  Leave the ice on for 20 minutes, 2-3 times a day for the first 2-3 days. After that, ice and heat may be alternated to reduce pain and spasms.  Maintain a healthy weight. Excess weight puts extra stress on your back and makes it difficult to maintain good posture. Contact a health care provider if:  You have pain that is not relieved with rest or medicine.  You have increasing pain going down into the legs or buttocks.  You have pain that does not improve in one week.  You have night pain.  You lose weight.  You have a fever or chills. Get help right away if:  You develop new bowel or bladder control problems.  You have unusual weakness or numbness in your arms or legs.  You develop nausea or vomiting.  You develop abdominal pain.  You feel faint. This information is not intended to replace advice given to you by your health care provider.  Make sure you discuss any questions you have with your health care provider. Document Released: 09/30/2005 Document Revised: 02/08/2016 Document Reviewed: 02/01/2014 Elsevier Interactive Patient Education  2017 [employer]   Rash A rash is a change in the color of the skin. A rash can also change the way your skin feels. There are many different conditions and factors that can cause a rash. Follow these instructions at home: Pay attention to any changes in your symptoms. Follow these instructions to help with your  condition: Medicine  Take or apply over-the-counter and prescription medicines only as told by your doctor. These may include:  Corticosteroid cream.  Anti-itch lotions.  Oral antihistamines. Skin Care   Put cool compresses on the affected areas.  Try taking a bath with:  Epsom salts. Follow the instructions on the packaging. You can get these at your local pharmacy or grocery store.  Baking soda. Pour a small amount into the bath as told by your doctor.  Colloidal oatmeal. Follow the instructions on the packaging. You can get this at your local pharmacy or grocery store.  Try putting baking soda paste onto your skin. Stir water into baking soda until it gets like a paste.  Do not scratch or rub your skin.  Avoid covering the rash. Make sure the rash is exposed to air as much as possible. General instructions   Avoid hot showers or baths, which can make itching worse. A cold shower may help.  Avoid scented soaps, detergents, and perfumes. Use gentle soaps, detergents, perfumes, and other cosmetic products.  Avoid anything that causes your rash. Keep a journal to help track what causes your rash. Write down:  What you eat.  What cosmetic products you use.  What you drink.  What you wear. This includes jewelry.  Keep all follow-up visits as told by your doctor. This is important. Contact a doctor if:  You sweat at night.  You lose weight.  You pee (urinate) more than normal.  You feel weak.  You throw up (vomit).  Your skin or the whites of your eyes look yellow (jaundice).  Your skin:  Tingles.  Is numb.  Your rash:  Does not go away after a few days.  Gets worse.  You are:  More thirsty than normal.  More tired than normal.  You have:  New symptoms.  Pain in your belly (abdomen).  A fever.  Watery poop (diarrhea). Get help right away if:  Your rash covers all or most of your body. The rash may or may not be painful.  You have  blisters that:  Are on top of the rash.  Grow larger.  Grow together.  Are painful.  Are inside your nose or mouth.  You have a rash that:  Looks like purple pinprick-sized spots all over your body.  Has a "bull's eye" or looks like a target.  Is red and painful, causes your skin to peel, and is not from being in the sun too long. This information is not intended to replace advice given to you by your health care provider. Make sure you discuss any questions you have with your health care provider. Document Released: 03/18/2008 Document Revised: 03/07/2016 Document Reviewed: 02/15/2015 Elsevier Interactive Patient Education  2017 [employer].

## 2017-01-06 NOTE — Progress Notes (Signed)
Subjective:  Patient ID: Stacey Mason, female    DOB: 1961-04-04  Age: 56 y.o. MRN: [id number]  CC: Establish Care   HPI Stacey Mason presents for   Chronic back pain: 2 years chronic lower back pain . Pain 7/10. Denies back injury. History of x-ray performed with MRI of spine ordered in the past. Reports not getting MRI of spine done due to lack of health insurance at the time. Denies any paresthesias or bowel or bladder incontinence. Reports bilateral feet hurting. Reports taking OTC ibuprofen for pain with minimal relief of symptoms.   Hypothyroidism: Reports being without synthroid for several months prior to Jan. 2108 due to lack of health insurance. Denies any CP or SOB.   HTN: Reports being adherent with blood pressure medication. Denies any CP, SOB, or BLE swelling. Reports not taking BP medication prior to office visit today.   Dermatitis: Itching couple months to bilateral arms. Reports using over counter creams for itching, olive oil, or coconut oil without minimal relief of symptoms. Denies any being around anyone with similar symptoms. Denies increased itching at night.  She denies any new contacts with soap, detergents, plants, insects or animals.    Outpatient Medications Prior to Visit  Medication Sig Dispense Refill  . lisinopril-hydrochlorothiazide (PRINZIDE,ZESTORETIC) 10-12.5 MG tablet Take 1 tablet by mouth daily. 30 tablet 1  . ibuprofen (ADVIL,MOTRIN) 200 MG tablet Take 400 mg by mouth every 4 (four) hours as needed for moderate pain.    Marland Kitchen levothyroxine (SYNTHROID, LEVOTHROID) 100 MCG tablet Take 1 tablet (100 mcg total) by mouth daily before breakfast. 90 tablet 1  . ALPRAZolam (XANAX) 0.5 MG tablet Take 1-2 tabs 60 min prior to procedure. Make take another 1-2 tabs immed prior to procedure if needed. (Patient not taking: Reported on 08/18/2015) 15 tablet 0  . HYDROcodone-acetaminophen (NORCO/VICODIN) 5-325 MG tablet Take 1-2 tablets by mouth every 6 hours as needed  for pain and/or cough. 11 tablet 0  . predniSONE (DELTASONE) 20 MG tablet Take 4 tabs po qd x 2d, 3 tabs po qd x 2d, 2 tabs po qd x 3d, 1 tab po qd x3d, 1/2 tab po qd x 2d. (Patient not taking: Reported on 10/17/2016) 24 tablet 0  . tizanidine (ZANAFLEX) 6 MG capsule Take 1 capsule (6 mg total) by mouth at bedtime. (Patient not taking: Reported on 10/17/2016) 30 capsule 1  . traMADol (ULTRAM) 50 MG tablet Take 1 tablet (50 mg total) by mouth every 8 (eight) hours as needed. (Patient not taking: Reported on 10/17/2016) 30 tablet 1   No facility-administered medications prior to visit.     ROS Review of Systems  Respiratory: Negative.   Cardiovascular: Negative.   Gastrointestinal: Negative.   Musculoskeletal: Positive for back pain (chronic).  Skin: Positive for rash (arms).       pruritus    Objective:  BP 140/81 (BP Location: Left Arm, Patient Position: Sitting, Cuff Size: Normal)   Pulse 97   Temp 98 F (36.7 C) (Oral)   Resp 18   Ht 5\' 5"  (1.651 m)   Wt 198 lb 12.8 oz (90.2 kg)   SpO2 98%   BMI 33.08 kg/m   BP/Weight 01/06/2017 10/17/2016 23/04/6282  Systolic BP 151 761 607  Diastolic BP 81 67 80  Wt. (Lbs) 198.8 175 182  BMI 33.08 28.25 29.82   Physical Exam  Eyes: Conjunctivae are normal. Pupils are equal, round, and reactive to light.  Neck: No JVD present.  Cardiovascular: Normal rate, regular  rhythm, normal heart sounds and intact distal pulses.   Pulmonary/Chest: Effort normal and breath sounds normal.  Abdominal: Soft. Bowel sounds are normal.  Musculoskeletal:       Lumbar back: She exhibits pain.  Skin: Skin is warm and dry. No rash noted. No erythema.  Nursing note and vitals reviewed.   Assessment & Plan:   Problem List Items Addressed This Visit      Cardiovascular and Mediastinum   HTN (hypertension)   Relevant Orders   Lipid Panel (Completed)   Microalbumin/Creatinine Ratio, Urine (Completed)     Endocrine   Hypothyroidism   Relevant Orders   TSH  (Completed)    Other Visit Diagnoses    Chronic bilateral low back pain without sciatica    -  Primary   Relevant Medications   ibuprofen (ADVIL,MOTRIN) 800 MG tablet   Other Relevant Orders   Ambulatory referral to Orthopedics   Dermatitis       Relevant Medications   triamcinolone (KENALOG) 0.025 % ointment      Meds ordered this encounter  Medications  . triamcinolone (KENALOG) 0.025 % ointment    Sig: Apply 1 application topically 2 (two) times daily.    Dispense:  30 g    Refill:  0    Order Specific Question:   Supervising Provider    Answer:   Tresa Garter [id number]  . ibuprofen (ADVIL,MOTRIN) 800 MG tablet    Sig: Take 1 tablet (800 mg total) by mouth every 8 (eight) hours as needed for moderate pain (Take with food.).    Dispense:  40 tablet    Refill:  0    Order Specific Question:   Supervising Provider    Answer:   Tresa Garter [id number]    Follow-up: Return in about 2 weeks (around 01/20/2017) for Blood pressure check with Stacey Mason.   Stacey Spruce FNP

## 2017-01-06 NOTE — Progress Notes (Signed)
Patient is here for lower back pain & also her right & left side of her back  Patient stated that it hurst more when she works but when she stayed at home she is fine  Patient ha snot taking her BP med for today  Patient has not eaten  for today  Patient also stated that she has some rashes on her left/right arm and legs & it itches it becomes her skin dry

## 2017-01-07 ENCOUNTER — Other Ambulatory Visit: Payer: Self-pay | Admitting: Family Medicine

## 2017-01-07 LAB — LIPID PANEL
CHOL/HDL RATIO: 4.8 ratio — AB (ref 0.0–4.4)
Cholesterol, Total: 224 mg/dL — ABNORMAL HIGH (ref 100–199)
HDL: 47 mg/dL (ref >39)
LDL Calculated: 152 mg/dL — ABNORMAL HIGH (ref 0–99)
Triglycerides: 123 mg/dL (ref 0–149)
VLDL Cholesterol Cal: 25 mg/dL (ref 5–40)

## 2017-01-07 LAB — MICROALBUMIN / CREATININE URINE RATIO
CREATININE, UR: 130.9 mg/dL
Microalb/Creat Ratio: 15.9 mg/g creat (ref 0.0–30.0)
Microalbumin, Urine: 20.8 ug/mL

## 2017-01-07 LAB — TSH: TSH: 9.89 u[IU]/mL — ABNORMAL HIGH (ref 0.450–4.500)

## 2017-01-09 ENCOUNTER — Other Ambulatory Visit: Payer: Self-pay | Admitting: Family Medicine

## 2017-01-09 ENCOUNTER — Telehealth: Payer: Self-pay

## 2017-01-09 DIAGNOSIS — E782 Mixed hyperlipidemia: Secondary | ICD-10-CM

## 2017-01-09 DIAGNOSIS — E039 Hypothyroidism, unspecified: Secondary | ICD-10-CM

## 2017-01-09 DIAGNOSIS — Z760 Encounter for issue of repeat prescription: Secondary | ICD-10-CM

## 2017-01-09 MED ORDER — LEVOTHYROXINE SODIUM 125 MCG PO TABS: 125.0000 ug | ORAL_TABLET | Freq: Every day | ORAL | 3 refills | Status: DC

## 2017-01-09 MED ORDER — ROSUVASTATIN CALCIUM 20 MG PO TABS: 20.0000 mg | ORAL_TABLET | Freq: Every day | ORAL | 2 refills | Status: DC

## 2017-01-09 NOTE — Telephone Encounter (Signed)
CMA call to inform patient about results  Patient did not answer but left a VM stating the reason of the call & to call me back

## 2017-01-09 NOTE — Telephone Encounter (Signed)
-----   Message from Alfonse Spruce, [place] sent at 01/09/2017  4:46 PM EDT ----- -Thyroid function test is elevated. Which indicates hypothyroidism is not well controlled. Your dose of levothyroxine will be increased.  -Follow up thyroid testing with lab in 6 weeks. -Lipid levels were elevated. This can increase your risk of heart disease. You will be prescribed Crestor to help lower these levels. Repeat lipid labs in 3 months. -Microalbumin/creatinine ratio level was normal. This tests for protein in your urine that could indicate early signs of kidney damage.

## 2017-01-15 ENCOUNTER — Telehealth: Payer: Self-pay | Admitting: Family Medicine

## 2017-01-15 NOTE — Telephone Encounter (Signed)
Pt. Called requesting her lab results. Please f/u °

## 2017-01-16 ENCOUNTER — Ambulatory Visit: Payer: Self-pay | Attending: Family Medicine

## 2017-01-16 NOTE — Telephone Encounter (Signed)
CMA call patient to inform about lab results Patient Verify DOB Patient was aware and understood

## 2017-05-26 ENCOUNTER — Emergency Department (HOSPITAL_COMMUNITY)
Admission: EM | Admit: 2017-05-26 | Discharge: 2017-05-26 | Disposition: A | Discharge status: Home / Self Care | Payer: Self-pay | Attending: Emergency Medicine | Admitting: Emergency Medicine

## 2017-05-26 ENCOUNTER — Encounter (HOSPITAL_COMMUNITY): Payer: Self-pay | Admitting: Emergency Medicine

## 2017-05-26 DIAGNOSIS — Z7982 Long term (current) use of aspirin: Secondary | ICD-10-CM | POA: Insufficient documentation

## 2017-05-26 DIAGNOSIS — G43009 Migraine without aura, not intractable, without status migrainosus: Secondary | ICD-10-CM

## 2017-05-26 DIAGNOSIS — Z79899 Other long term (current) drug therapy: Secondary | ICD-10-CM | POA: Insufficient documentation

## 2017-05-26 DIAGNOSIS — I10 Essential (primary) hypertension: Secondary | ICD-10-CM | POA: Insufficient documentation

## 2017-05-26 MED ORDER — DEXAMETHASONE SODIUM PHOSPHATE 10 MG/ML IJ SOLN
10.0000 mg | Freq: Once | INTRAMUSCULAR | Status: AC
  Administered 2017-05-26: 10 mg via INTRAVENOUS
  Filled 2017-05-26: qty 1

## 2017-05-26 MED ORDER — IBUPROFEN 200 MG PO TABS
400.0000 mg | ORAL_TABLET | Freq: Once | ORAL | Status: AC | PRN
  Administered 2017-05-26: 400 mg via ORAL
  Filled 2017-05-26: qty 2

## 2017-05-26 MED ORDER — DIPHENHYDRAMINE HCL 50 MG/ML IJ SOLN
25.0000 mg | Freq: Once | INTRAMUSCULAR | Status: AC
  Administered 2017-05-26: 25 mg via INTRAVENOUS
  Filled 2017-05-26: qty 1

## 2017-05-26 MED ORDER — SODIUM CHLORIDE 0.9 % IV BOLUS (SEPSIS)
1000.0000 mL | Freq: Once | INTRAVENOUS | Status: AC
  Administered 2017-05-26: 1000 mL via INTRAVENOUS

## 2017-05-26 MED ORDER — LISINOPRIL-HYDROCHLOROTHIAZIDE 10-12.5 MG PO TABS: 1.0000 | ORAL_TABLET | Freq: Every day | ORAL | 0 refills | Status: DC

## 2017-05-26 MED ORDER — FENTANYL CITRATE (PF) 100 MCG/2ML IJ SOLN
25.0000 ug | Freq: Once | INTRAMUSCULAR | Status: AC
  Administered 2017-05-26: 25 ug via INTRAVENOUS
  Filled 2017-05-26: qty 2

## 2017-05-26 MED ORDER — KETOROLAC TROMETHAMINE 30 MG/ML IJ SOLN
30.0000 mg | Freq: Once | INTRAMUSCULAR | Status: AC
  Administered 2017-05-26: 30 mg via INTRAVENOUS
  Filled 2017-05-26: qty 1

## 2017-05-26 MED ORDER — PROCHLORPERAZINE EDISYLATE 5 MG/ML IJ SOLN
10.0000 mg | Freq: Once | INTRAMUSCULAR | Status: AC
  Administered 2017-05-26: 10 mg via INTRAVENOUS
  Filled 2017-05-26: qty 2

## 2017-05-26 NOTE — Discharge Instructions (Signed)
Return here as needed.  Follow-up with your primary doctor. °

## 2017-05-26 NOTE — ED Triage Notes (Signed)
Pt states that she has had a HA since last night. Neuro intact. Alert and oriented.

## 2017-05-26 NOTE — ED Notes (Signed)
Patient was educated not to drive, operate heavy machinery, or drink alcohol while taking narcotic medication.  

## 2017-05-26 NOTE — ED Provider Notes (Signed)
[facility] DEPT Provider Note   CSN: [id number] Arrival date & time: 05/26/17  1014     History   Chief Complaint Chief Complaint  Patient presents with  . Headache    HPI Stacey Mason is a 56 y.o. female.  HPI Patient presents to the emergency department with migraine headache that started yesterday morning.  The patient went to a funeral yesterday and her headache got worse as time went on, due to the fact she was crying a lot.  The patient states that the stress and motion seem to bring the headache gone.  The patient states nothing seems make the condition better light and sound make her condition worse.  She states she did have nausea but no vomiting.  Tried taking aspirin with no relief of her headacheThe patient denies chest pain, shortness of breath, blurred vision, neck pain, fever, cough, weakness, numbness, dizziness, anorexia, edema, abdominal pain, vomiting, diarrhea, rash, back pain, dysuria, hematemesis, bloody stool, near syncope, or syncope. Past Medical History:  Diagnosis Date  . Cardiac murmur   . Hyperlipidemia   . Hypertension   . Thyroid disease     Patient Active Problem List   Diagnosis Date Noted  . HTN (hypertension) 02/26/2013  . Hypothyroidism 02/26/2013  . Hyperglycemia 02/26/2013  . Hyperlipidemia 02/26/2013    Past Surgical History:  Procedure Laterality Date  . BREAST SURGERY      OB History    No data available       Home Medications    Prior to Admission medications   Medication Sig Start Date End Date Taking? Authorizing Provider  aspirin 325 MG tablet Take 325 mg by mouth daily.   Yes [provider]  ibuprofen (ADVIL,MOTRIN) 200 MG tablet Take 200-400 mg by mouth every 6 (six) hours as needed for headache.   Yes [provider]  levothyroxine (SYNTHROID, LEVOTHROID) 125 MCG tablet Take 1 tablet (125 mcg total) by mouth daily. 01/09/17  Yes Hairston, Maylon Peppers, FNP  lisinopril-hydrochlorothiazide  (PRINZIDE,ZESTORETIC) 10-12.5 MG tablet Take 1 tablet by mouth daily. 10/17/16  Yes Pisciotta, Elmyra Ricks, PA-C  triamcinolone (KENALOG) 0.025 % ointment Apply 1 application topically 2 (two) times daily. 01/06/17  Yes Hairston, Maylon Peppers, FNP  ALPRAZolam (XANAX) 0.5 MG tablet Take 1-2 tabs 60 min prior to procedure. Make take another 1-2 tabs immed prior to procedure if needed. Patient not taking: Reported on 08/18/2015 06/30/15   Shawnee Knapp, MD  HYDROcodone-acetaminophen (NORCO/VICODIN) 5-325 MG tablet Take 1-2 tablets by mouth every 6 hours as needed for pain and/or cough. Patient not taking: Reported on 05/26/2017 10/17/16   Pisciotta, Elmyra Ricks, PA-C  ibuprofen (ADVIL,MOTRIN) 800 MG tablet Take 1 tablet (800 mg total) by mouth every 8 (eight) hours as needed for moderate pain (Take with food.). Patient not taking: Reported on 05/26/2017 01/06/17   Alfonse Spruce, FNP  predniSONE (DELTASONE) 20 MG tablet Take 4 tabs po qd x 2d, 3 tabs po qd x 2d, 2 tabs po qd x 3d, 1 tab po qd x3d, 1/2 tab po qd x 2d. Patient not taking: Reported on 05/26/2017 08/18/15   Shawnee Knapp, MD  rosuvastatin (CRESTOR) 20 MG tablet Take 1 tablet (20 mg total) by mouth daily. Patient not taking: Reported on 05/26/2017 01/09/17   Alfonse Spruce, FNP  tizanidine (ZANAFLEX) 6 MG capsule Take 1 capsule (6 mg total) by mouth at bedtime. Patient not taking: Reported on 10/17/2016 08/18/15   Shawnee Knapp, MD  traMADol Veatrice Bourbon) 50  MG tablet Take 1 tablet (50 mg total) by mouth every 8 (eight) hours as needed. Patient not taking: Reported on 10/17/2016 08/18/15   Shawnee Knapp, MD    Family History Family History  Problem Relation Age of Onset  . Diabetes Father   . Hyperlipidemia Father   . Hypertension Father     Social History Social History  Substance Use Topics  . Smoking status: Never Smoker  . Smokeless tobacco: Never Used  . Alcohol use No     Allergies   Patient has no known allergies.   Review of Systems Review of  Systems All other systems negative except as documented in the HPI. All pertinent positives and negatives as reviewed in the HPI.  Physical Exam Updated Vital Signs BP (!) 143/83   Pulse 83   Temp 97.8 F (36.6 C) (Oral)   Resp 16   SpO2 100%   Physical Exam  Constitutional: She is oriented to person, place, and time. She appears well-developed and well-nourished. No distress.  HENT:  Head: Normocephalic and atraumatic.  Mouth/Throat: Oropharynx is clear and moist.  Eyes: Pupils are equal, round, and reactive to light.  Neck: Normal range of motion. Neck supple.  Cardiovascular: Normal rate, regular rhythm and normal heart sounds.  Exam reveals no gallop and no friction rub.   No murmur heard. Pulmonary/Chest: Effort normal and breath sounds normal. No respiratory distress. She has no wheezes.  Neurological: She is alert and oriented to person, place, and time. No sensory deficit. She exhibits normal muscle tone. Coordination normal.  Skin: Skin is warm and dry. Capillary refill takes less than 2 seconds. No rash noted. No erythema.  Psychiatric: She has a normal mood and affect. Her behavior is normal.  Nursing note and vitals reviewed.    ED Treatments / Results  Labs (all labs ordered are listed, but only abnormal results are displayed) Labs Reviewed - No data to display  EKG  EKG Interpretation None       Radiology No results found.  Procedures Procedures (including critical care time)  Medications Ordered in ED Medications  ibuprofen (ADVIL,MOTRIN) tablet 400 mg (400 mg Oral Given 05/26/17 1051)  sodium chloride 0.9 % bolus 1,000 mL (0 mLs Intravenous Stopped 05/26/17 1551)  ketorolac (TORADOL) 30 MG/ML injection 30 mg (30 mg Intravenous Given 05/26/17 1344)  prochlorperazine (COMPAZINE) injection 10 mg (10 mg Intravenous Given 05/26/17 1346)  diphenhydrAMINE (BENADRYL) injection 25 mg (25 mg Intravenous Given 05/26/17 1343)  fentaNYL (SUBLIMAZE) injection 25 mcg  (25 mcg Intravenous Given 05/26/17 1548)     Initial Impression / Assessment and Plan / ED Course  I have reviewed the triage vital signs and the nursing notes.  Pertinent labs & imaging results that were available during my care of the patient were reviewed by me and considered in my medical decision making (see chart for details).     Patient has migraine headache.  We will treat for this with fluids and migraine cocktail.  The patient is advised return here as needed.  Patient agrees the plan and all questions were answered Final Clinical Impressions(s) / ED Diagnoses   Final diagnoses:  None    New Prescriptions New Prescriptions   No medications on file     Rebeca Allegra 05/26/17 [place] Nathan, MD 05/27/17 0800

## 2018-04-24 ENCOUNTER — Encounter: Payer: Self-pay | Admitting: Internal Medicine

## 2018-04-24 ENCOUNTER — Ambulatory Visit: Payer: Self-pay | Attending: Internal Medicine | Admitting: Internal Medicine

## 2018-04-24 VITALS — BP 175/98 | HR 90 | Temp 98.7°F | Resp 16 | Ht 65.0 in | Wt 205.4 lb

## 2018-04-24 DIAGNOSIS — I10 Essential (primary) hypertension: Secondary | ICD-10-CM | POA: Insufficient documentation

## 2018-04-24 DIAGNOSIS — E785 Hyperlipidemia, unspecified: Secondary | ICD-10-CM | POA: Insufficient documentation

## 2018-04-24 DIAGNOSIS — M533 Sacrococcygeal disorders, not elsewhere classified: Secondary | ICD-10-CM | POA: Insufficient documentation

## 2018-04-24 DIAGNOSIS — E039 Hypothyroidism, unspecified: Secondary | ICD-10-CM | POA: Insufficient documentation

## 2018-04-24 MED ORDER — LISINOPRIL-HYDROCHLOROTHIAZIDE 20-12.5 MG PO TABS: 1.0000 | ORAL_TABLET | Freq: Every day | ORAL | 3 refills | Status: DC

## 2018-04-24 MED ORDER — TRAMADOL HCL 50 MG PO TABS: 50.0000 mg | ORAL_TABLET | Freq: Three times a day (TID) | ORAL | 0 refills | Status: DC | PRN

## 2018-04-24 MED ORDER — LEVOTHYROXINE SODIUM 125 MCG PO TABS: 125.0000 ug | ORAL_TABLET | Freq: Every day | ORAL | 3 refills | Status: DC

## 2018-04-24 MED FILL — LISINOPRIL-HCTZ 20-12.5 MG: 20-12.5 | 30 days supply | Qty: 30 | Fill #0

## 2018-04-24 MED FILL — traMADol HCL 50 MG TABS: 50 | 10 days supply | Qty: 30 | Fill #0

## 2018-04-24 NOTE — Progress Notes (Signed)
Patient ID: Stacey Mason, female    DOB: Nov 24, 1960  MRN: [id number]  CC: re-estbalish and Hypertension   Subjective: Stacey Mason is a 57 y.o. female who presents for chronic ds and to establish with me as PCP Her concerns today include:  Hypothyroid, HTN, HL and chronic LBP  HTN: compliant with med Checks BP a few times a mth. Usually 120/70s Limits salt in foods Uncle died this a.m and she is a bit upset about that No chest pains or shortness of breath.  Hypothyroid: compliant with levothyroxine.  She denies any hot or cold intolerance.  No palpitations.  No unexplained weight changes.  Chronic LBP:  Slip and fell on hard wood floor 1 wk ago.  Since then she has had significant pain in her tailbone.  Hurts to sit or to move from side to side.  Better standing up.  No radiation of pain down the legs.  No numbness or tingling in the legs.  HL: I see Crestor on med list but patient states that she has not been taking that.  She is agreeable to having cholesterol level checked today  Patient Active Problem List   Diagnosis Date Noted  . HTN (hypertension) 02/26/2013  . Hypothyroidism 02/26/2013  . Hyperglycemia 02/26/2013  . Hyperlipidemia 02/26/2013     Current Outpatient Medications on File Prior to Visit  Medication Sig Dispense Refill  . aspirin 325 MG tablet Take 325 mg by mouth daily.     No current facility-administered medications on file prior to visit.     No Known Allergies  Social History   Socioeconomic History  . Marital status: Single    Spouse name: Not on file  . Number of children: Not on file  . Years of education: Not on file  . Highest education level: Not on file  Occupational History  . Not on file  Social Needs  . Financial resource strain: Not on file  . Food insecurity:    Worry: Not on file    Inability: Not on file  . Transportation needs:    Medical: Not on file    Non-medical: Not on file  Tobacco Use  . Smoking status: Never  Smoker  . Smokeless tobacco: Never Used  Substance and Sexual Activity  . Alcohol use: No  . Drug use: No  . Sexual activity: Never  Lifestyle  . Physical activity:    Days per week: Not on file    Minutes per session: Not on file  . Stress: Not on file  Relationships  . Social connections:    Talks on phone: Not on file    Gets together: Not on file    Attends religious service: Not on file    Active member of club or organization: Not on file    Attends meetings of clubs or organizations: Not on file    Relationship status: Not on file  . Intimate partner violence:    Fear of current or ex partner: Not on file    Emotionally abused: Not on file    Physically abused: Not on file    Forced sexual activity: Not on file  Other Topics Concern  . Not on file  Social History Narrative   From [place].  In the [place] since 2003.    Family History  Problem Relation Age of Onset  . Diabetes Father   . Hyperlipidemia Father   . Hypertension Father     Past Surgical History:  Procedure Laterality  Date  . BREAST SURGERY      ROS: Review of Systems Negative except as stated above. PHYSICAL EXAM: BP (!) 175/98   Pulse 90   Temp 98.7 F (37.1 C) (Oral)   Resp 16   Ht 5\' 5"  (1.651 m)   Wt 205 lb 6.4 oz (93.2 kg)   SpO2 98%   BMI 34.18 kg/m   Repeat blood pressure 170/100 Physical Exam  General appearance - alert, well appearing, middle-aged female and in no distress Mental status - normal mood, behavior, speech, dress, motor activity, and thought processes Neck - supple, no significant adenopathy.  No thyroid enlargement. Chest - clear to auscultation, no wheezes, rales or rhonchi, symmetric air entry Heart - normal rate, regular rhythm, normal S1, S2, no murmurs, rubs, clicks or gallops Musculoskeletal -moderate tenderness over the coccyx.   Extremities - peripheral pulses normal, no pedal edema, no clubbing or cyanosis Neuro: Lower extremities- power in lower extremities  5 out of 5 bilaterally.  Gross sensation intact in the lower extremities.  ASSESSMENT AND PLAN: 1. Essential hypertension Not at goal.  Increase lisinopril/HCTZ to 20/12.5 mg daily Advised to check blood pressure 1- 2 x a week. - CBC - Comprehensive metabolic panel - Lipid panel - lisinopril-hydrochlorothiazide (ZESTORETIC) 20-12.5 MG tablet; Take 1 tablet by mouth daily.  Dispense: 90 tablet; Refill: 3  2. Tail bone pain Told patient that if she did fracture her tailbone it will be treated by pain management as no invasive procedure can be done given the location of the tailbone.  She is still wanting to get an x-ray done to see whether there is a fracture or not. - DG Sacrum/Coccyx; Future - traMADol (ULTRAM) 50 MG tablet; Take 1 tablet (50 mg total) by mouth every 8 (eight) hours as needed.  Dispense: 30 tablet; Refill: 0  3. Hypothyroidism, unspecified type - TSH  4. Hyperlipidemia, unspecified hyperlipidemia type We will hold off on restarting Crestor until we have checked lipid profile.  Patient was given the opportunity to ask questions.  Patient verbalized understanding of the plan and was able to repeat key elements of the plan.   Orders Placed This Encounter  Procedures  . DG Sacrum/Coccyx  . CBC  . Comprehensive metabolic panel  . Lipid panel  . TSH     Requested Prescriptions   Signed Prescriptions Disp Refills  . traMADol (ULTRAM) 50 MG tablet 30 tablet 0    Sig: Take 1 tablet (50 mg total) by mouth every 8 (eight) hours as needed.  Marland Kitchen lisinopril-hydrochlorothiazide (ZESTORETIC) 20-12.5 MG tablet 90 tablet 3    Sig: Take 1 tablet by mouth daily.  Marland Kitchen levothyroxine (SYNTHROID, LEVOTHROID) 125 MCG tablet 90 tablet 3    Sig: Take 1 tablet (125 mcg total) by mouth daily.    Return in about 3 months (around 07/25/2018).  Karle Plumber, MD, FACP

## 2018-04-24 NOTE — Progress Notes (Signed)
Pt states she has a disc in her lower back

## 2018-04-25 LAB — CBC
HEMATOCRIT: 38.4 % (ref 34.0–46.6)
Hemoglobin: 12.4 g/dL (ref 11.1–15.9)
MCH: 27.3 pg (ref 26.6–33.0)
MCHC: 32.3 g/dL (ref 31.5–35.7)
MCV: 85 fL (ref 79–97)
Platelets: 451 10*3/uL — ABNORMAL HIGH (ref 150–450)
RBC: 4.54 x10E6/uL (ref 3.77–5.28)
RDW: 15.5 % — ABNORMAL HIGH (ref 12.3–15.4)
WBC: 6.5 10*3/uL (ref 3.4–10.8)

## 2018-04-25 LAB — LIPID PANEL
CHOL/HDL RATIO: 4.6 ratio — AB (ref 0.0–4.4)
Cholesterol, Total: 206 mg/dL — ABNORMAL HIGH (ref 100–199)
HDL: 45 mg/dL (ref >39)
LDL Calculated: 135 mg/dL — ABNORMAL HIGH (ref 0–99)
TRIGLYCERIDES: 130 mg/dL (ref 0–149)
VLDL CHOLESTEROL CAL: 26 mg/dL (ref 5–40)

## 2018-04-25 LAB — COMPREHENSIVE METABOLIC PANEL
ALBUMIN: 4.4 g/dL (ref 3.5–5.5)
ALK PHOS: 118 IU/L — AB (ref 39–117)
ALT: 13 IU/L (ref 0–32)
AST: 14 IU/L (ref 0–40)
Albumin/Globulin Ratio: 1.5 (ref 1.2–2.2)
BUN/Creatinine Ratio: 16 (ref 9–23)
BUN: 11 mg/dL (ref 6–24)
Bilirubin Total: <0.2 mg/dL (ref 0.0–1.2)
CO2: 20 mmol/L (ref 20–29)
CREATININE: 0.68 mg/dL (ref 0.57–1.00)
Calcium: 9.4 mg/dL (ref 8.7–10.2)
Chloride: 107 mmol/L — ABNORMAL HIGH (ref 96–106)
GFR, EST AFRICAN AMERICAN: 114 mL/min/{1.73_m2} (ref >59)
GFR, EST NON AFRICAN AMERICAN: 99 mL/min/{1.73_m2} (ref >59)
GLOBULIN, TOTAL: 2.9 g/dL (ref 1.5–4.5)
GLUCOSE: 84 mg/dL (ref 65–99)
POTASSIUM: 4.5 mmol/L (ref 3.5–5.2)
Sodium: 142 mmol/L (ref 134–144)
Total Protein: 7.3 g/dL (ref 6.0–8.5)

## 2018-04-25 LAB — TSH: TSH: 11 u[IU]/mL — ABNORMAL HIGH (ref 0.450–4.500)

## 2018-04-26 ENCOUNTER — Other Ambulatory Visit: Payer: Self-pay | Admitting: Internal Medicine

## 2018-04-26 MED ORDER — PRAVASTATIN SODIUM 20 MG PO TABS: 20.0000 mg | ORAL_TABLET | Freq: Every day | ORAL | 3 refills | Status: DC

## 2018-04-26 MED ORDER — LEVOTHYROXINE SODIUM 150 MCG PO TABS: 150.0000 ug | ORAL_TABLET | Freq: Every day | ORAL | 6 refills | Status: DC

## 2018-04-27 ENCOUNTER — Ambulatory Visit (HOSPITAL_COMMUNITY)
Admission: RE | Admit: 2018-04-27 | Discharge: 2018-04-27 | Disposition: A | Discharge status: Home / Self Care | Payer: Self-pay | Source: Ambulatory Visit | Attending: Internal Medicine | Admitting: Internal Medicine

## 2018-04-27 DIAGNOSIS — M533 Sacrococcygeal disorders, not elsewhere classified: Secondary | ICD-10-CM | POA: Insufficient documentation

## 2018-05-01 ENCOUNTER — Telehealth: Payer: Self-pay

## 2018-05-01 NOTE — Telephone Encounter (Signed)
Contacted pt to go over lab results and xray results pt didn't answer lvm asking pt to give me a call at her earliest convenience

## 2018-05-07 ENCOUNTER — Telehealth: Payer: Self-pay | Admitting: Internal Medicine

## 2018-05-07 NOTE — Telephone Encounter (Signed)
Patient called for lab results. Please call patient back.

## 2018-05-07 NOTE — Telephone Encounter (Signed)
Left message on voicemail to return call. Attempt to inform patient about lab results as requested.   Notes recorded by Ladell Pier, MD on 04/26/2018 at 8:24 AM EDT Thyroid level is not at goal. If she has been taking her thyroid medication consistently over the past few mths, then I recommend increasing dose of Levothyroxine to 150 mcg daily. New rxn sent to pharmacy. Blood count is normal. Kidney and LFTs nl. Cholesterol level 135 with goal being less than 100. I recommend starting cholesterol lowering medication called Pravastatin. Rx sent to pharmacy.

## 2018-05-15 NOTE — Telephone Encounter (Signed)
Pt aware of result note per Dr. Wynetta Emery. Aware of medication dose change and medications at pharmacy.

## 2018-07-24 ENCOUNTER — Ambulatory Visit: Payer: Self-pay | Admitting: Internal Medicine

## 2018-10-28 ENCOUNTER — Ambulatory Visit: Payer: Self-pay

## 2018-11-06 ENCOUNTER — Ambulatory Visit: Payer: Self-pay | Attending: Internal Medicine | Admitting: Internal Medicine

## 2018-11-06 ENCOUNTER — Encounter: Payer: Self-pay | Admitting: Internal Medicine

## 2018-11-06 VITALS — BP 175/86 | HR 97 | Temp 98.7°F | Resp 16 | Ht 65.0 in | Wt 202.8 lb

## 2018-11-06 DIAGNOSIS — M7989 Other specified soft tissue disorders: Secondary | ICD-10-CM | POA: Insufficient documentation

## 2018-11-06 DIAGNOSIS — Z7982 Long term (current) use of aspirin: Secondary | ICD-10-CM | POA: Insufficient documentation

## 2018-11-06 DIAGNOSIS — M79669 Pain in unspecified lower leg: Secondary | ICD-10-CM | POA: Insufficient documentation

## 2018-11-06 DIAGNOSIS — Z8249 Family history of ischemic heart disease and other diseases of the circulatory system: Secondary | ICD-10-CM | POA: Insufficient documentation

## 2018-11-06 DIAGNOSIS — I10 Essential (primary) hypertension: Secondary | ICD-10-CM | POA: Insufficient documentation

## 2018-11-06 DIAGNOSIS — Z2821 Immunization not carried out because of patient refusal: Secondary | ICD-10-CM

## 2018-11-06 DIAGNOSIS — E039 Hypothyroidism, unspecified: Secondary | ICD-10-CM | POA: Insufficient documentation

## 2018-11-06 DIAGNOSIS — Z79890 Hormone replacement therapy (postmenopausal): Secondary | ICD-10-CM | POA: Insufficient documentation

## 2018-11-06 DIAGNOSIS — R739 Hyperglycemia, unspecified: Secondary | ICD-10-CM | POA: Insufficient documentation

## 2018-11-06 DIAGNOSIS — E7800 Pure hypercholesterolemia, unspecified: Secondary | ICD-10-CM | POA: Insufficient documentation

## 2018-11-06 DIAGNOSIS — Z833 Family history of diabetes mellitus: Secondary | ICD-10-CM | POA: Insufficient documentation

## 2018-11-06 DIAGNOSIS — N950 Postmenopausal bleeding: Secondary | ICD-10-CM | POA: Insufficient documentation

## 2018-11-06 DIAGNOSIS — Z79899 Other long term (current) drug therapy: Secondary | ICD-10-CM | POA: Insufficient documentation

## 2018-11-06 DIAGNOSIS — M79661 Pain in right lower leg: Secondary | ICD-10-CM

## 2018-11-06 DIAGNOSIS — L309 Dermatitis, unspecified: Secondary | ICD-10-CM | POA: Insufficient documentation

## 2018-11-06 MED ORDER — HYDROCHLOROTHIAZIDE 25 MG PO TABS: 25.0000 mg | ORAL_TABLET | Freq: Every day | ORAL | 3 refills | Status: DC

## 2018-11-06 MED ORDER — PRAVASTATIN SODIUM 20 MG PO TABS: 20.0000 mg | ORAL_TABLET | Freq: Every day | ORAL | 3 refills | Status: DC

## 2018-11-06 MED ORDER — TRIAMCINOLONE ACETONIDE 0.1 % EX CREA: 1.0000 "application " | TOPICAL_CREAM | Freq: Two times a day (BID) | CUTANEOUS | 1 refills | Status: DC

## 2018-11-06 MED ORDER — LISINOPRIL 10 MG PO TABS: 10.0000 mg | ORAL_TABLET | Freq: Every day | ORAL | 3 refills | Status: DC

## 2018-11-06 MED FILL — LISINOPRIL 10 MG TABS: 10 | 30 days supply | Qty: 30 | Fill #0

## 2018-11-06 MED FILL — TRIAMCINOLONE ACETONIDE 0.1: 0.1 | 15 days supply | Qty: 30 | Fill #0

## 2018-11-06 MED FILL — PRAVASTATIN SODIUM 20 MG TA: 20 | 30 days supply | Qty: 30 | Fill #0

## 2018-11-06 MED FILL — HYDROCHLOROTHIAZIDE 25 MG T: 25 | 30 days supply | Qty: 30 | Fill #0

## 2018-11-06 NOTE — Patient Instructions (Signed)
Your blood pressure is not controlled.  We have changed lisinopril to 10 mg daily.  Start hydrochlorothiazide 25 mg daily.  Try to limit salt in the foods as much as possible.  Please complete form for the orange card/cone discount card so that we can refer you to a gynecologist for the postmenopausal bleeding that you have been having.  Please purchase a pair of compression stockings/socks and wear them during the day when you are up on your feet.Marland Kitchen

## 2018-11-06 NOTE — Progress Notes (Signed)
Patient ID: Stacey Mason, female    DOB: [birth year]  MRN: [id number]  CC: Hypertension   Subjective: Stacey Mason is a 58 y.o. female who presents for chronic ds management.  Last seen 04/2018.  Daughter, Stacey Mason, is with her Her concerns today include:  Hypothyroid, HTN, HL and chronic LBP  Reports having vaginal bleeding.  Had bleeding/spotting episode in October that lasted several days.  Had 3 light episodes in November, twice in Dec and once this mth.  Prior to Octo 2019, last menses was 7 yrs ago.  Not sexually active -no family hx of cervical or uterine CA  C/o rash on arms and legs x 5 yrs.  Itches a lot.  No one else in family with this The rash moves around from one extremity to another.  Worse on the right lower extremity where it has caused hyperpigmentation and hardening of the skin.  No triggers.  She has tried changing her body products with no relief -uses coconut oil, and Vaseline OTC -uses OTC Benadryl and Hydrocortisone cream which helps to calm it down  Thyroid: She feels that she is on too high a dose of levothyroxine.  Dose was increased from 125 mcg to 150 mcg on last visit as TSH was 11.  HTN: Lisinopril/HCTZ dose was increased from 10/12.5mg  to 20/12.5 mg on last visit due to blood pressure not being at goal.  She reports dizziness and headache with inc dose of Lisinopril and wants to go back to the 10 mg. Did not take med as yet for a.m Check BP once a mth reports 140-145/80 to range She notes swelling in both lower extremities but right lower extremity has been more swollen and painful for the past few months.  She has not had any injury to the leg.  Some pain in the lower back at times but goes down the right leg  HL: Reports compliance with Pravachol.  I recommend starting it after last visit as ASCVD risk score was 18%  Patient Active Problem List   Diagnosis Date Noted  . HTN (hypertension) 02/26/2013  . Hypothyroidism 02/26/2013  . Hyperglycemia  02/26/2013  . Hyperlipidemia 02/26/2013     Current Outpatient Medications on File Prior to Visit  Medication Sig Dispense Refill  . aspirin 325 MG tablet Take 325 mg by mouth daily.    Marland Kitchen levothyroxine (SYNTHROID, LEVOTHROID) 150 MCG tablet Take 1 tablet (150 mcg total) by mouth daily. 30 tablet 6  . traMADol (ULTRAM) 50 MG tablet Take 1 tablet (50 mg total) by mouth every 8 (eight) hours as needed. (Patient not taking: Reported on 11/06/2018) 30 tablet 0   No current facility-administered medications on file prior to visit.     No Known Allergies  Social History   Socioeconomic History  . Marital status: Single    Spouse name: Not on file  . Number of children: Not on file  . Years of education: Not on file  . Highest education level: Not on file  Occupational History  . Not on file  Social Needs  . Financial resource strain: Not on file  . Food insecurity:    Worry: Not on file    Inability: Not on file  . Transportation needs:    Medical: Not on file    Non-medical: Not on file  Tobacco Use  . Smoking status: Never Smoker  . Smokeless tobacco: Never Used  Substance and Sexual Activity  . Alcohol use: No  . Drug use: No  .  Sexual activity: Never  Lifestyle  . Physical activity:    Days per week: Not on file    Minutes per session: Not on file  . Stress: Not on file  Relationships  . Social connections:    Talks on phone: Not on file    Gets together: Not on file    Attends religious service: Not on file    Active member of club or organization: Not on file    Attends meetings of clubs or organizations: Not on file    Relationship status: Not on file  . Intimate partner violence:    Fear of current or ex partner: Not on file    Emotionally abused: Not on file    Physically abused: Not on file    Forced sexual activity: Not on file  Other Topics Concern  . Not on file  Social History Narrative   From [place].  In the [place] since 2003.    Family History    Problem Relation Age of Onset  . Diabetes Father   . Hyperlipidemia Father   . Hypertension Father     Past Surgical History:  Procedure Laterality Date  . BREAST SURGERY      ROS: Review of Systems Negative except as above PHYSICAL EXAM: BP (!) 175/86   Pulse 97   Temp 98.7 F (37.1 C) (Oral)   Resp 16   Ht 5\' 5"  (1.651 m)   Wt 202 lb 12.8 oz (92 kg)   SpO2 99%   BMI 33.75 kg/m   Wt Readings from Last 3 Encounters:  11/06/18 202 lb 12.8 oz (92 kg)  04/24/18 205 lb 6.4 oz (93.2 kg)  01/06/17 198 lb 12.8 oz (90.2 kg)    Physical Exam  General appearance - alert, well appearing, and in no distress Mental status - normal mood, behavior, speech, dress, motor activity, and thought processes Eyes - pupils equal and reactive, extraocular eye movements intact Neck - supple, no significant adenopathy Chest - clear to auscultation, no wheezes, rales or rhonchi, symmetric air entry Heart - normal rate, regular rhythm, normal S1, S2. 1-2/6 SEM LSB Pelvic -deferred as patient declined having it done today.  She is agreeable to having it done on follow-up visit Extremities -1+ bilateral lower extremity edema.  Right lower leg noted to be larger than the left with increased circumference of the calf.  No tenderness on palpation of the calf. Skin -skin on the upper arms appear dry with no significant rash seen.  Examination of the right lower leg shows mild hyperpigmentation with thickening of the skin from below the knee to the lower shin anterio-laterally.  She has some varicose veins in the lower extremities.     Results for orders placed or performed in visit on 04/24/18  CBC  Result Value Ref Range   WBC 6.5 3.4 - 10.8 x10E3/uL   RBC 4.54 3.77 - 5.28 x10E6/uL   Hemoglobin 12.4 11.1 - 15.9 g/dL   Hematocrit 38.4 34.0 - 46.6 %   MCV 85 79 - 97 fL   MCH 27.3 26.6 - 33.0 pg   MCHC 32.3 31.5 - 35.7 g/dL   RDW 15.5 (H) 12.3 - 15.4 %   Platelets 451 (H) 150 - 450 x10E3/uL   Comprehensive metabolic panel  Result Value Ref Range   Glucose 84 65 - 99 mg/dL   BUN 11 6 - 24 mg/dL   Creatinine, Ser 0.68 0.57 - 1.00 mg/dL   GFR calc non Af Wyvonnia Lora  99 >59 mL/min/1.73   GFR calc Af Amer 114 >59 mL/min/1.73   BUN/Creatinine Ratio 16 9 - 23   Sodium 142 134 - 144 mmol/L   Potassium 4.5 3.5 - 5.2 mmol/L   Chloride 107 (H) 96 - 106 mmol/L   CO2 20 20 - 29 mmol/L   Calcium 9.4 8.7 - 10.2 mg/dL   Total Protein 7.3 6.0 - 8.5 g/dL   Albumin 4.4 3.5 - 5.5 g/dL   Globulin, Total 2.9 1.5 - 4.5 g/dL   Albumin/Globulin Ratio 1.5 1.2 - 2.2   Bilirubin Total <0.2 0.0 - 1.2 mg/dL   Alkaline Phosphatase 118 (H) 39 - 117 IU/L   AST 14 0 - 40 IU/L   ALT 13 0 - 32 IU/L  Lipid panel  Result Value Ref Range   Cholesterol, Total 206 (H) 100 - 199 mg/dL   Triglycerides 130 0 - 149 mg/dL   HDL 45 >39 mg/dL   VLDL Cholesterol Cal 26 5 - 40 mg/dL   LDL Calculated 135 (H) 0 - 99 mg/dL   Chol/HDL Ratio 4.6 (H) 0.0 - 4.4 ratio  TSH  Result Value Ref Range   TSH 11.000 (H) 0.450 - 4.500 uIU/mL     ASSESSMENT AND PLAN: 1. Post-menopausal bleeding Patient declined pelvic/Pap today.  We will do it on a follow-up visit. She needs to see gynecology but currently is uninsured.  Advised that she obtain forms for the orange card and cone discount card today and fill them out and return as soon as possible.  We will submit referral to gynecology on follow-up visit in 4 weeks.  Hopefully she has obtained approval by then - US Pelvic Complete With Transvaginal; Future - US Transvaginal Non-OB; Future  2. Dermatitis Most noticeable on the right lower extremity with differential diagnosis to include skin changes due to hypothyroidism versus other etiology.  The hypothyroidism should not cause itching.  She does seem to have some component of venous stasis changes in the right lower extremity so I recommend purchasing some compression stockings/socks and wear them during the day when she is  ambulatory.  We can refer to dermatology if no improvement. - triamcinolone cream (KENALOG) 0.1 %; Apply 1 application topically 2 (two) times daily.  Dispense: 30 g; Refill: 1  3. Essential hypertension Not at goal.  Since increased dose of lisinopril is causing dizziness, we will decrease lisinopril back to 10 mg and increase the hydrochlorothiazide to 25 mg.  DASH diet discussed and encouraged - lisinopril (PRINIVIL,ZESTRIL) 10 MG tablet; Take 1 tablet (10 mg total) by mouth daily.  Dispense: 90 tablet; Refill: 3 - hydrochlorothiazide (HYDRODIURIL) 25 MG tablet; Take 1 tablet (25 mg total) by mouth daily.  Dispense: 90 tablet; Refill: 3  4. Acquired hypothyroidism We will recheck TSH today - TSH  5. Influenza vaccination declined   6. Pure hypercholesterolemia - pravastatin (PRAVACHOL) 20 MG tablet; Take 1 tablet (20 mg total) by mouth daily.  Dispense: 90 tablet; Refill: 3  7. Pain and swelling of lower leg, right - VAS [place] LOWER EXTREMITY VENOUS (DVT); Future  Patient wanted to discuss pain in the lower back but I told her that we will have to take this up on her next visit in the interest of time. Patient was given the opportunity to ask questions.  Patient verbalized understanding of the plan and was able to repeat key elements of the plan.   Orders Placed This Encounter  Procedures  . US Pelvic Complete With  Transvaginal  . US Transvaginal Non-OB  . TSH     Requested Prescriptions   Signed Prescriptions Disp Refills  . pravastatin (PRAVACHOL) 20 MG tablet 90 tablet 3    Sig: Take 1 tablet (20 mg total) by mouth daily.  Marland Kitchen lisinopril (PRINIVIL,ZESTRIL) 10 MG tablet 90 tablet 3    Sig: Take 1 tablet (10 mg total) by mouth daily.  . hydrochlorothiazide (HYDRODIURIL) 25 MG tablet 90 tablet 3    Sig: Take 1 tablet (25 mg total) by mouth daily.  Marland Kitchen triamcinolone cream (KENALOG) 0.1 % 30 g 1    Sig: Apply 1 application topically 2 (two) times daily.    Return in about 4  weeks (around 12/04/2018) for PAP and back pain.  Karle Plumber, MD, FACP

## 2018-11-06 NOTE — Progress Notes (Signed)
Pt states 3 months ago she will get a period time to time   Pt states she has discomfort in her back  Pt states she has a rash that comes and goes on different parts of her body

## 2018-11-07 LAB — TSH: TSH: 7.53 u[IU]/mL — AB (ref 0.450–4.500)

## 2018-11-09 ENCOUNTER — Ambulatory Visit (HOSPITAL_COMMUNITY)
Admission: RE | Admit: 2018-11-09 | Discharge: 2018-11-09 | Disposition: A | Discharge status: Home / Self Care | Payer: Self-pay | Source: Ambulatory Visit | Attending: Internal Medicine | Admitting: Internal Medicine

## 2018-11-09 DIAGNOSIS — M7989 Other specified soft tissue disorders: Secondary | ICD-10-CM

## 2018-11-09 DIAGNOSIS — M79661 Pain in right lower leg: Secondary | ICD-10-CM

## 2018-11-11 ENCOUNTER — Ambulatory Visit (HOSPITAL_COMMUNITY)
Admission: RE | Admit: 2018-11-11 | Discharge: 2018-11-11 | Disposition: A | Discharge status: Home / Self Care | Payer: Self-pay | Source: Ambulatory Visit | Attending: Internal Medicine | Admitting: Internal Medicine

## 2018-11-11 ENCOUNTER — Other Ambulatory Visit: Payer: Self-pay | Admitting: Internal Medicine

## 2018-11-11 DIAGNOSIS — N950 Postmenopausal bleeding: Secondary | ICD-10-CM | POA: Insufficient documentation

## 2018-11-16 ENCOUNTER — Telehealth: Payer: Self-pay

## 2018-11-16 NOTE — Telephone Encounter (Signed)
error 

## 2018-11-16 NOTE — Telephone Encounter (Signed)
Contacted pt daughter and provided lab/doppler/us results pt daughter doesn't have any questions or concerns

## 2018-11-20 ENCOUNTER — Telehealth: Payer: Self-pay | Admitting: Internal Medicine

## 2018-11-20 NOTE — Telephone Encounter (Signed)
Stacey Mason called to check on the status of the referral for the gynecologist. Says they were wanting to contact the clinic directly in order to help speed the process for an appointment. Please follow up.

## 2018-11-20 NOTE — Telephone Encounter (Signed)
Pt was last seen on 11/06/2018. Per Dr. Wynetta Emery office note "She needs to see gynecology but currently is uninsured.  Advised that she obtain forms for the orange card and cone discount card today and fill them out and return as soon as possible.  We will submit referral to gynecology on follow-up visit in 4 weeks.  Hopefully she has obtained approval by then"  If pt doesn't have insurance she will be paying out of pocket for visit with gyn. Please find out if pt has application for oc/cafa

## 2018-11-20 NOTE — Telephone Encounter (Signed)
Contacted patient in regards to CAFA/OC/BC application and informed of message. Patient says she will be coming in on Monday.;

## 2018-11-25 ENCOUNTER — Ambulatory Visit: Payer: Self-pay | Attending: Family Medicine | Admitting: Physician Assistant

## 2018-11-25 VITALS — BP 161/102 | HR 86 | Temp 98.0°F | Resp 18 | Ht 65.0 in | Wt 200.0 lb

## 2018-11-25 DIAGNOSIS — G8929 Other chronic pain: Secondary | ICD-10-CM

## 2018-11-25 DIAGNOSIS — M25561 Pain in right knee: Secondary | ICD-10-CM

## 2018-11-25 DIAGNOSIS — E7800 Pure hypercholesterolemia, unspecified: Secondary | ICD-10-CM

## 2018-11-25 DIAGNOSIS — I10 Essential (primary) hypertension: Secondary | ICD-10-CM

## 2018-11-25 DIAGNOSIS — L309 Dermatitis, unspecified: Secondary | ICD-10-CM

## 2018-11-25 DIAGNOSIS — M5441 Lumbago with sciatica, right side: Secondary | ICD-10-CM

## 2018-11-25 DIAGNOSIS — N950 Postmenopausal bleeding: Secondary | ICD-10-CM

## 2018-11-25 DIAGNOSIS — M25562 Pain in left knee: Secondary | ICD-10-CM

## 2018-11-25 DIAGNOSIS — E039 Hypothyroidism, unspecified: Secondary | ICD-10-CM

## 2018-11-25 MED ORDER — MELOXICAM 7.5 MG PO TABS: 7.5000 mg | ORAL_TABLET | Freq: Every day | ORAL | 0 refills | Status: DC

## 2018-11-25 MED ORDER — LEVOTHYROXINE SODIUM 150 MCG PO TABS: 150.0000 ug | ORAL_TABLET | Freq: Every day | ORAL | 6 refills | Status: DC

## 2018-11-25 MED ORDER — HYDROCHLOROTHIAZIDE 25 MG PO TABS: 25.0000 mg | ORAL_TABLET | Freq: Every day | ORAL | 3 refills | Status: DC

## 2018-11-25 MED ORDER — LISINOPRIL 20 MG PO TABS: 20.0000 mg | ORAL_TABLET | Freq: Every day | ORAL | 3 refills | Status: DC

## 2018-11-25 MED FILL — LISINOPRIL 20 MG TAB: 20 | 30 days supply | Qty: 30 | Fill #0

## 2018-11-25 NOTE — Progress Notes (Signed)
Patient ID: Stacey Mason, female   DOB: 1961/04/07, 58 y.o.   MRN: [id number]   Stacey Mason, is a 58 y.o. female  [id number]  [id number]  DOB - 14-Nov-1960  Subjective:  Chief Complaint and HPI: Stacey Mason is a 58 y.o. female here today for follow-up on R leg, R knee, and lower back pain.  Also for referral from abnormal U/S for post-menopausal bleeding.  R back and leg pain for ~ 6weeks.  NKI.  No urinary s/sx.  Patient and her daughter give history.  No further bleeding.  Needs to get orange card.  Also pruritic rash, triamcinilone helps some.     ROS:   Constitutional:  No f/c, No night sweats, No unexplained weight loss. EENT:  No vision changes, No blurry vision, No hearing changes. No mouth, throat, or ear problems.  Respiratory: No cough, No SOB Cardiac: No CP, no palpitations GI:  No abd pain, No N/V/D. GU: No Urinary s/sx Musculoskeletal: see above Neuro: No headache, no dizziness, no motor weakness.  Skin: + rash Endocrine:  No polydipsia. No polyuria.  Psych: Denies SI/HI  No problems updated.  ALLERGIES: No Known Allergies  PAST MEDICAL HISTORY: Past Medical History:  Diagnosis Date  . Cardiac murmur   . Hyperlipidemia   . Hypertension   . Thyroid disease     MEDICATIONS AT HOME: Prior to Admission medications   Medication Sig Start Date End Date Taking? Authorizing Provider  aspirin 325 MG tablet Take 325 mg by mouth daily.   Yes [provider]  hydrochlorothiazide (HYDRODIURIL) 25 MG tablet Take 1 tablet (25 mg total) by mouth daily. 11/25/18  Yes Argentina Donovan, PA-C  levothyroxine (SYNTHROID, LEVOTHROID) 150 MCG tablet Take 1 tablet (150 mcg total) by mouth daily. 04/26/18  Yes Ladell Pier, MD  lisinopril (PRINIVIL,ZESTRIL) 20 MG tablet Take 1 tablet (20 mg total) by mouth daily. 11/25/18  Yes Freeman Caldron M, PA-C  pravastatin (PRAVACHOL) 20 MG tablet Take 1 tablet (20 mg total) by mouth daily. 11/06/18  Yes Ladell Pier, MD  traMADol (ULTRAM) 50 MG tablet Take 1 tablet (50 mg total) by mouth every 8 (eight) hours as needed. 04/24/18  Yes Ladell Pier, MD  triamcinolone cream (KENALOG) 0.1 % Apply 1 application topically 2 (two) times daily. 11/06/18  Yes Ladell Pier, MD  meloxicam (MOBIC) 7.5 MG tablet Take 1 tablet (7.5 mg total) by mouth daily. Prn pain 11/25/18   Argentina Donovan, PA-C     Objective:  EXAM:   Vitals:   11/25/18 1617  BP: (!) 161/102  Pulse: 86  Resp: 18  Temp: 98 F (36.7 C)  TempSrc: Oral  SpO2: 100%  Weight: 200 lb (90.7 kg)  Height: 5\' 5"  (1.651 m)    General appearance : A&OX3. NAD. Non-toxic-appearing HEENT: Atraumatic and Normocephalic.  PERRLA. EOM intact. Neck: supple, no JVD. No cervical lymphadenopathy. No thyromegaly Chest/Lungs:  Breathing-non-labored, Good air entry bilaterally, breath sounds normal without rales, rhonchi, or wheezing  CVS: S1 S2 regular, no murmurs, gallops, rubs  Back- ROM ~80% of normal.  Neg SLRB.  DTR=intact B.   B knees with crepitus but ligaments stable and ROM is good.   Extremities: Bilateral Lower Ext shows no edema, both legs are warm to touch with = pulse throughout Neurology:  CN II-XII grossly intact, Non focal.   Psych:  TP linear. J/I WNL. Normal speech. Appropriate eye contact and affect.  Skin:  ?dry skin, some hyperpigmentation(see last  note)no definite rash  Data Review Lab Results  Component Value Date   HGBA1C 5.9 (H) 10/25/2014   HGBA1C 5.5 02/26/2013     Assessment & Plan   1. Post-menopausal bleeding - Ambulatory referral to Gynecology  2. Essential hypertension Uncontrolled-increase dose lisinopril - lisinopril (PRINIVIL,ZESTRIL) 20 MG tablet; Take 1 tablet (20 mg total) by mouth daily.  Dispense: 90 tablet; Refill: 3 - Comprehensive metabolic panel - CBC with Differential/Platelet - hydrochlorothiazide (HYDRODIURIL) 25 MG tablet; Take 1 tablet (25 mg total) by mouth daily.  Dispense: 90  tablet; Refill: 3  3. Acquired hypothyroidism Continue current dose unless needs to be changed when labs are back - Thyroid Panel With TSH  4. Pure hypercholesterolemia - Lipid panel  5. Chronic pain of both knees - meloxicam (MOBIC) 7.5 MG tablet; Take 1 tablet (7.5 mg total) by mouth daily. Prn pain  Dispense: 30 tablet; Refill: 0 - Ambulatory referral to Orthopedic Surgery  6. Acute right-sided low back pain with right-sided sciatica No red flags - meloxicam (MOBIC) 7.5 MG tablet; Take 1 tablet (7.5 mg total) by mouth daily. Prn pain  Dispense: 30 tablet; Refill: 0 - Ambulatory referral to Orthopedic Surgery  7. Dermatitis - Ambulatory referral to Dermatology     Patient have been counseled extensively about nutrition and exercise  Return for 12/07/2018 appt with Dr Wynetta Emery.  The patient was given clear instructions to go to ER or return to medical center if symptoms don't improve, worsen or new problems develop. The patient verbalized understanding. The patient was told to call to get lab results if they haven't heard anything in the next week.     Freeman Caldron, PA-C [facility] [place] [facility], [place]   11/25/2018, 4:37 PM

## 2018-11-26 MED FILL — LEVOTHYROXINE 150 MCG TAB: 150 | 30 days supply | Qty: 30 | Fill #0

## 2018-11-26 MED FILL — MELOXICAM 7.5 MG TABLET: 7.5 | 30 days supply | Qty: 30 | Fill #0

## 2018-11-27 ENCOUNTER — Telehealth: Payer: Self-pay | Admitting: Obstetrics and Gynecology

## 2018-11-27 NOTE — Telephone Encounter (Signed)
Called and left a message for patient to call back to schedule a new patient doctor referral appointment with our office to see any doctor for: PMB.

## 2018-11-28 LAB — COMPREHENSIVE METABOLIC PANEL
ALBUMIN: 4.7 g/dL (ref 3.8–4.9)
ALT: 9 IU/L (ref 0–32)
AST: 17 IU/L (ref 0–40)
Albumin/Globulin Ratio: 1.7 (ref 1.2–2.2)
Alkaline Phosphatase: 132 IU/L — ABNORMAL HIGH (ref 39–117)
BUN / CREAT RATIO: 15 (ref 9–23)
BUN: 11 mg/dL (ref 6–24)
Bilirubin Total: <0.2 mg/dL (ref 0.0–1.2)
CALCIUM: 9.7 mg/dL (ref 8.7–10.2)
CO2: 16 mmol/L — AB (ref 20–29)
CREATININE: 0.73 mg/dL (ref 0.57–1.00)
Chloride: 111 mmol/L — ABNORMAL HIGH (ref 96–106)
GFR calc Af Amer: 107 mL/min/{1.73_m2} (ref >59)
GFR, EST NON AFRICAN AMERICAN: 93 mL/min/{1.73_m2} (ref >59)
GLUCOSE: 85 mg/dL (ref 65–99)
Globulin, Total: 2.8 g/dL (ref 1.5–4.5)
Potassium: 4.1 mmol/L (ref 3.5–5.2)
Sodium: 141 mmol/L (ref 134–144)
Total Protein: 7.5 g/dL (ref 6.0–8.5)

## 2018-11-28 LAB — LIPID PANEL
CHOL/HDL RATIO: 4.7 ratio — AB (ref 0.0–4.4)
CHOLESTEROL TOTAL: 245 mg/dL — AB (ref 100–199)
HDL: 52 mg/dL (ref >39)
LDL CALC: 177 mg/dL — AB (ref 0–99)
TRIGLYCERIDES: 80 mg/dL (ref 0–149)
VLDL CHOLESTEROL CAL: 16 mg/dL (ref 5–40)

## 2018-11-28 LAB — THYROID PANEL WITH TSH
Free Thyroxine Index: 1.9 (ref 1.2–4.9)
T3 Uptake Ratio: 22 % — ABNORMAL LOW (ref 24–39)
T4 TOTAL: 8.7 ug/dL (ref 4.5–12.0)
TSH: 11.79 u[IU]/mL — ABNORMAL HIGH (ref 0.450–4.500)

## 2018-11-30 NOTE — Telephone Encounter (Signed)
Called and left a message for patient to call back to schedule a new patient doctor referral appointment with our office to see any doctor for: PMB.

## 2018-12-02 ENCOUNTER — Other Ambulatory Visit: Payer: Self-pay | Admitting: Physician Assistant

## 2018-12-02 ENCOUNTER — Ambulatory Visit: Payer: Self-pay

## 2018-12-02 MED ORDER — LEVOTHYROXINE SODIUM 175 MCG PO TABS: 175.0000 ug | ORAL_TABLET | Freq: Every day | ORAL | 5 refills | Status: DC

## 2018-12-02 MED FILL — LEVOTHYROXINE 175 MCG TAB: 175 | 30 days supply | Qty: 30 | Fill #0

## 2018-12-02 NOTE — Telephone Encounter (Signed)
Called and left a message for patient to call back to schedule a new patient doctor referral appointment with our officeto see any doctor for: PMB.

## 2018-12-03 ENCOUNTER — Telehealth: Payer: Self-pay

## 2018-12-03 NOTE — Telephone Encounter (Signed)
CMA spoke to patient's daughter to inform on results.  Pt. Daughter is aware and will inform the patient.

## 2018-12-03 NOTE — Telephone Encounter (Signed)
I'm routing this referral back to the referring office. Patient has not returned several calls from our office to schedule an appointment.

## 2018-12-03 NOTE — Telephone Encounter (Signed)
-----   Message from Argentina Donovan, [place] sent at 12/02/2018  1:05 PM EST ----- Please call patient.  Tell her I have adjusted her dose of synthroid(thyroid medication) to 175 micrograms daily.  I sent her a new prescription to our pharmacy.  She should stop the 155micrograms daily for now.  Recheck level 2-3 months.  Make sure and take cholesterol meds as her cholesterol is high.  Eat a heart healthy diet also.  Other labs are ok.  Follow-up as planned.  Thanks, Freeman Caldron, PA-C

## 2018-12-04 MED FILL — TRIAMCINOLONE ACETONIDE 0.1: 0.1 | 15 days supply | Qty: 30 | Fill #1

## 2018-12-07 ENCOUNTER — Other Ambulatory Visit: Payer: Self-pay | Admitting: Internal Medicine

## 2019-01-08 ENCOUNTER — Telehealth: Payer: Self-pay | Admitting: Internal Medicine

## 2019-01-08 NOTE — Telephone Encounter (Signed)
Please schedule pt an telephone visit

## 2019-01-08 NOTE — Telephone Encounter (Signed)
Patient daughter called to schedule an appointment for the patient. Patient daughter states patient has had a sore throat for the last two weeks.   Please follow up

## 2019-01-12 ENCOUNTER — Ambulatory Visit (HOSPITAL_BASED_OUTPATIENT_CLINIC_OR_DEPARTMENT_OTHER): Payer: Self-pay | Admitting: Internal Medicine

## 2019-01-12 ENCOUNTER — Ambulatory Visit: Payer: Self-pay | Admitting: Internal Medicine

## 2019-01-12 ENCOUNTER — Other Ambulatory Visit: Payer: Self-pay

## 2019-01-12 DIAGNOSIS — Z5329 Procedure and treatment not carried out because of patient's decision for other reasons: Secondary | ICD-10-CM

## 2019-01-13 NOTE — Progress Notes (Signed)
appt was cancelled and rescheduled.

## 2019-02-16 ENCOUNTER — Ambulatory Visit: Payer: Self-pay | Attending: Internal Medicine | Admitting: Internal Medicine

## 2019-02-16 ENCOUNTER — Other Ambulatory Visit: Payer: Self-pay

## 2019-04-23 ENCOUNTER — Ambulatory Visit: Payer: Self-pay | Admitting: Internal Medicine

## 2019-05-04 ENCOUNTER — Ambulatory Visit: Payer: Self-pay | Admitting: Nurse Practitioner

## 2019-05-28 ENCOUNTER — Other Ambulatory Visit: Payer: Self-pay

## 2019-05-28 ENCOUNTER — Encounter: Payer: Self-pay | Admitting: Nurse Practitioner

## 2019-05-28 ENCOUNTER — Ambulatory Visit: Payer: Self-pay | Attending: Nurse Practitioner | Admitting: Nurse Practitioner

## 2019-05-28 DIAGNOSIS — E7800 Pure hypercholesterolemia, unspecified: Secondary | ICD-10-CM

## 2019-05-28 DIAGNOSIS — N939 Abnormal uterine and vaginal bleeding, unspecified: Secondary | ICD-10-CM

## 2019-05-28 DIAGNOSIS — I10 Essential (primary) hypertension: Secondary | ICD-10-CM

## 2019-05-28 DIAGNOSIS — E039 Hypothyroidism, unspecified: Secondary | ICD-10-CM

## 2019-05-28 MED ORDER — AMLODIPINE BESYLATE 10 MG PO TABS: 10.0000 mg | ORAL_TABLET | Freq: Every day | ORAL | 3 refills | Status: DC

## 2019-05-28 MED ORDER — LISINOPRIL 10 MG PO TABS: 10.0000 mg | ORAL_TABLET | Freq: Every day | ORAL | 0 refills | Status: DC

## 2019-05-28 MED ORDER — PRAVASTATIN SODIUM 20 MG PO TABS: 20.0000 mg | ORAL_TABLET | Freq: Every day | ORAL | 3 refills | Status: DC

## 2019-05-28 NOTE — Progress Notes (Signed)
Virtual Visit via Telephone Note Due to national recommendations of social distancing due to [place] 19, telehealth visit is felt to be most appropriate for this patient at this time.  I discussed the limitations, risks, security and privacy concerns of performing an evaluation and management service by telephone and the availability of in person appointments. I also discussed with the patient that there may be a patient responsible charge related to this service. The patient expressed understanding and agreed to proceed.    I connected with [name] on 05/28/19  at   3:30 PM EDT  EDT by telephone and verified that I am speaking with the correct person using two identifiers.   Consent I discussed the limitations, risks, security and privacy concerns of performing an evaluation and management service by telephone and the availability of in person appointments. I also discussed with the patient that there may be a patient responsible charge related to this service. The patient expressed understanding and agreed to proceed.   Location of Patient: Private  Residence   Location of Provider: [place] [facility] [employer] Office    Persons participating in Telemedicine visit: Geryl Rankins FNP-BC [place]      History of Present Illness: Telemedicine visit for: Establish care History given by patient and her duaghter  has a past medical history of Abnormal uterine bleeding (AUB), Cardiac murmur, Hyperlipidemia, Hypertension, and Thyroid disease.  AUB She continue to experience post menopausal bleeding. States intermittent and ongoing for several months. She was referred to [facility] health however states she did not receive any of their calls or voice mails for scheduling. I will place another referral and her daughter will be scheduling for her. She has never had a pap smear or mammogram and daughter states she is very apprehensive regarding have either performed.     Essential Hypertension Chronic and poorly controlled. She is currently taking lisinopril 10 mg daily instead of 20mg . States the 20mg  tablet made her dizzy.  She monitors her blood pressure at home infrequently with last BP reading "125/80 something". As readings have been recorded as high I will add amlodipine 10 mg today. Denies chest pain, shortness of breath, palpitations, lightheadedness, dizziness, headaches or BLE edema.  BP Readings from Last 3 Encounters:  11/25/18 (!) 161/102  11/06/18 (!) 175/86  04/24/18 (!) 175/98    Hypothyroidism She has hypothyroidism and did not return in march to have her TSH repeated. Currently taking 175 mcg of synthroid daily.  She denies fatigue, weight changes, heat/cold intolerance, bowel/skin changes or CVS symptoms. Lab Results  Component Value Date   TSH 11.790 (H) 11/25/2018     Past Medical History:  Diagnosis Date  . Abnormal uterine bleeding (AUB)   . Cardiac murmur   . Hyperlipidemia   . Hypertension   . Thyroid disease     Past Surgical History:  Procedure Laterality Date  . BREAST SURGERY      Family History  Problem Relation Age of Onset  . Diabetes Father   . Hyperlipidemia Father   . Hypertension Father     Social History   Socioeconomic History  . Marital status: Married    Spouse name: Not on file  . Number of children: Not on file  . Years of education: Not on file  . Highest education level: Not on file  Occupational History  . Not on file  Social Needs  . Financial resource strain: Not on file  . Food insecurity  Worry: Not on file    Inability: Not on file  . Transportation needs    Medical: Not on file    Non-medical: Not on file  Tobacco Use  . Smoking status: Never Smoker  . Smokeless tobacco: Never Used  Substance and Sexual Activity  . Alcohol use: No  . Drug use: No  . Sexual activity: Not Currently  Lifestyle  . Physical activity    Days per week: Not on file    Minutes per  session: Not on file  . Stress: Not on file  Relationships  . Social Herbalist on phone: Not on file    Gets together: Not on file    Attends religious service: Not on file    Active member of club or organization: Not on file    Attends meetings of clubs or organizations: Not on file    Relationship status: Not on file  Other Topics Concern  . Not on file  Social History Narrative   From [place].  In the [place] since 2003.     Observations/Objective: Awake, alert and oriented x 3   Review of Systems  Constitutional: Negative for fever, malaise/fatigue and weight loss.  HENT: Negative.  Negative for nosebleeds.   Eyes: Negative.  Negative for blurred vision, double vision and photophobia.  Respiratory: Negative.  Negative for cough and shortness of breath.   Cardiovascular: Negative.  Negative for chest pain, palpitations and leg swelling.  Gastrointestinal: Negative.  Negative for heartburn, nausea and vomiting.  Genitourinary:       SEE HPI  Musculoskeletal: Negative.  Negative for myalgias.  Neurological: Negative.  Negative for dizziness, focal weakness, seizures and headaches.  Psychiatric/Behavioral: Negative.  Negative for suicidal ideas.    Assessment and Plan:  Diagnoses and all orders for this visit:  Essential hypertension -     lisinopril (ZESTRIL) 10 MG tablet; Take 1 tablet (10 mg total) by mouth daily. -     amLODipine (NORVASC) 10 MG tablet; Take 1 tablet (10 mg total) by mouth daily. -     Basic metabolic panel; Future Continue all antihypertensives as prescribed.  Remember to bring in your blood pressure log with you for your follow up appointment.  DASH/Mediterranean Diets are healthier choices for HTN.    Pure hypercholesterolemia -     pravastatin (PRAVACHOL) 20 MG tablet; Take 1 tablet (20 mg total) by mouth daily. -     Lipid panel; Future INSTRUCTIONS: Work on a low fat, heart healthy diet and participate in regular aerobic exercise  program by working out at least 150 minutes per week; 5 days a week-30 minutes per day. Avoid red meat, fried foods. junk foods, sodas, sugary drinks, unhealthy snacking, alcohol and smoking.  Drink at least 48oz of water per day and monitor your carbohydrate intake daily.    Abnormal uterine bleeding (AUB) -     Ambulatory referral to Gynecology -     CBC; Future  Hypothyroidism, unspecified type -     TSH; Future     Follow Up Instructions Return in about 2 weeks (around 06/11/2019).     I discussed the assessment and treatment plan with the patient. The patient was provided an opportunity to ask questions and all were answered. The patient agreed with the plan and demonstrated an understanding of the instructions.   The patient was advised to call back or seek an in-person evaluation if the symptoms worsen or if the condition fails to improve  as anticipated.  I provided 24 minutes of non-face-to-face time during this encounter including median intraservice time, reviewing previous notes, labs, imaging, medications and explaining diagnosis and management.  Gildardo Pounds, FNP-BC

## 2019-05-29 ENCOUNTER — Encounter: Payer: Self-pay | Admitting: Nurse Practitioner

## 2019-05-31 ENCOUNTER — Other Ambulatory Visit: Payer: Self-pay

## 2019-06-04 ENCOUNTER — Telehealth (HOSPITAL_COMMUNITY): Payer: Self-pay

## 2019-06-04 ENCOUNTER — Other Ambulatory Visit: Payer: Self-pay

## 2019-06-04 ENCOUNTER — Ambulatory Visit: Payer: Self-pay | Attending: Family Medicine

## 2019-06-04 DIAGNOSIS — N939 Abnormal uterine and vaginal bleeding, unspecified: Secondary | ICD-10-CM

## 2019-06-04 DIAGNOSIS — E039 Hypothyroidism, unspecified: Secondary | ICD-10-CM

## 2019-06-04 DIAGNOSIS — E7800 Pure hypercholesterolemia, unspecified: Secondary | ICD-10-CM

## 2019-06-04 DIAGNOSIS — I10 Essential (primary) hypertension: Secondary | ICD-10-CM

## 2019-06-04 NOTE — Telephone Encounter (Signed)
Telephoned patient at home using interpreter service [phone]. Left voice message for patient, call and schedule with [facility]

## 2019-06-05 LAB — BASIC METABOLIC PANEL
BUN/Creatinine Ratio: 17 (ref 9–23)
BUN: 14 mg/dL (ref 6–24)
CO2: 22 mmol/L (ref 20–29)
Calcium: 9.3 mg/dL (ref 8.7–10.2)
Chloride: 103 mmol/L (ref 96–106)
Creatinine, Ser: 0.81 mg/dL (ref 0.57–1.00)
GFR calc Af Amer: 94 mL/min/{1.73_m2} (ref >59)
GFR calc non Af Amer: 81 mL/min/{1.73_m2} (ref >59)
Glucose: 115 mg/dL — ABNORMAL HIGH (ref 65–99)
Potassium: 4 mmol/L (ref 3.5–5.2)
Sodium: 139 mmol/L (ref 134–144)

## 2019-06-05 LAB — CBC
Hematocrit: 39.4 % (ref 34.0–46.6)
Hemoglobin: 13 g/dL (ref 11.1–15.9)
MCH: 27.8 pg (ref 26.6–33.0)
MCHC: 33 g/dL (ref 31.5–35.7)
MCV: 84 fL (ref 79–97)
Platelets: 429 10*3/uL (ref 150–450)
RBC: 4.68 x10E6/uL (ref 3.77–5.28)
RDW: 13.3 % (ref 11.7–15.4)
WBC: 7.1 10*3/uL (ref 3.4–10.8)

## 2019-06-05 LAB — LIPID PANEL
Chol/HDL Ratio: 4.6 ratio — ABNORMAL HIGH (ref 0.0–4.4)
Cholesterol, Total: 227 mg/dL — ABNORMAL HIGH (ref 100–199)
HDL: 49 mg/dL (ref >39)
LDL Calculated: 156 mg/dL — ABNORMAL HIGH (ref 0–99)
Triglycerides: 112 mg/dL (ref 0–149)
VLDL Cholesterol Cal: 22 mg/dL (ref 5–40)

## 2019-06-05 LAB — TSH: TSH: 11.1 u[IU]/mL — ABNORMAL HIGH (ref 0.450–4.500)

## 2019-06-07 ENCOUNTER — Other Ambulatory Visit: Payer: Self-pay | Admitting: Nurse Practitioner

## 2019-06-07 MED ORDER — LEVOTHYROXINE SODIUM 175 MCG PO TABS: 175.0000 ug | ORAL_TABLET | Freq: Every day | ORAL | 1 refills | Status: DC

## 2019-06-11 ENCOUNTER — Ambulatory Visit: Payer: Self-pay | Admitting: Pharmacist

## 2019-06-15 ENCOUNTER — Telehealth: Payer: Self-pay | Admitting: Obstetrics & Gynecology

## 2019-06-15 NOTE — Telephone Encounter (Signed)
Daughter called to move her mother's appointment. She will be leaving the country. Appointment scheduled around  two weeks after [place].

## 2019-06-24 ENCOUNTER — Other Ambulatory Visit: Payer: Self-pay

## 2019-06-24 DIAGNOSIS — Z20822 Contact with and (suspected) exposure to covid-19: Secondary | ICD-10-CM

## 2019-06-25 LAB — NOVEL CORONAVIRUS, NAA: SARS-CoV-2, NAA: NOT DETECTED

## 2019-07-16 ENCOUNTER — Encounter: Payer: Self-pay | Admitting: Obstetrics and Gynecology

## 2019-07-22 ENCOUNTER — Other Ambulatory Visit: Payer: Self-pay

## 2019-08-12 ENCOUNTER — Encounter: Payer: Self-pay | Admitting: Obstetrics & Gynecology

## 2019-08-16 ENCOUNTER — Ambulatory Visit: Payer: Self-pay

## 2019-08-19 ENCOUNTER — Other Ambulatory Visit: Payer: Self-pay

## 2019-08-19 DIAGNOSIS — Z20822 Contact with and (suspected) exposure to covid-19: Secondary | ICD-10-CM

## 2019-08-21 LAB — NOVEL CORONAVIRUS, NAA: SARS-CoV-2, NAA: NOT DETECTED

## 2019-09-13 ENCOUNTER — Encounter: Payer: Self-pay | Admitting: Obstetrics & Gynecology

## 2019-09-14 ENCOUNTER — Other Ambulatory Visit: Payer: Self-pay

## 2019-09-14 DIAGNOSIS — Z20822 Contact with and (suspected) exposure to covid-19: Secondary | ICD-10-CM

## 2019-09-17 LAB — NOVEL CORONAVIRUS, NAA: SARS-CoV-2, NAA: NOT DETECTED

## 2019-10-19 ENCOUNTER — Encounter (HOSPITAL_COMMUNITY): Payer: Self-pay | Admitting: *Deleted

## 2019-10-19 ENCOUNTER — Other Ambulatory Visit: Payer: Self-pay

## 2019-10-19 ENCOUNTER — Emergency Department (HOSPITAL_COMMUNITY): Payer: Self-pay

## 2019-10-19 DIAGNOSIS — Z7982 Long term (current) use of aspirin: Secondary | ICD-10-CM | POA: Insufficient documentation

## 2019-10-19 DIAGNOSIS — Y93E1 Activity, personal bathing and showering: Secondary | ICD-10-CM | POA: Insufficient documentation

## 2019-10-19 DIAGNOSIS — E039 Hypothyroidism, unspecified: Secondary | ICD-10-CM | POA: Insufficient documentation

## 2019-10-19 DIAGNOSIS — Z79899 Other long term (current) drug therapy: Secondary | ICD-10-CM | POA: Insufficient documentation

## 2019-10-19 DIAGNOSIS — Y92002 Bathroom of unspecified non-institutional (private) residence single-family (private) house as the place of occurrence of the external cause: Secondary | ICD-10-CM | POA: Insufficient documentation

## 2019-10-19 DIAGNOSIS — W01198A Fall on same level from slipping, tripping and stumbling with subsequent striking against other object, initial encounter: Secondary | ICD-10-CM | POA: Insufficient documentation

## 2019-10-19 DIAGNOSIS — S7012XA Contusion of left thigh, initial encounter: Secondary | ICD-10-CM | POA: Insufficient documentation

## 2019-10-19 DIAGNOSIS — Y999 Unspecified external cause status: Secondary | ICD-10-CM | POA: Insufficient documentation

## 2019-10-19 DIAGNOSIS — I10 Essential (primary) hypertension: Secondary | ICD-10-CM | POA: Insufficient documentation

## 2019-10-19 NOTE — ED Triage Notes (Signed)
Pt getting out of bath tub, fell and hit left leg against toilet, swelling mid thigh.

## 2019-10-20 ENCOUNTER — Emergency Department (HOSPITAL_COMMUNITY)
Admission: EM | Admit: 2019-10-20 | Discharge: 2019-10-20 | Disposition: A | Discharge status: Home / Self Care | Payer: Self-pay | Attending: Emergency Medicine | Admitting: Emergency Medicine

## 2019-10-20 DIAGNOSIS — R52 Pain, unspecified: Secondary | ICD-10-CM

## 2019-10-20 DIAGNOSIS — S7012XA Contusion of left thigh, initial encounter: Secondary | ICD-10-CM

## 2019-10-20 MED ORDER — NAPROXEN 500 MG PO TABS: 500.0000 mg | ORAL_TABLET | Freq: Two times a day (BID) | ORAL | 0 refills | Status: DC | PRN

## 2019-10-20 NOTE — ED Notes (Signed)
Pt verbalized discharge instructions and follow up care. Alert and ambulatory. Assisted to husband's car. No IV. Husband is driving pt home

## 2019-10-20 NOTE — ED Provider Notes (Signed)
[facility] DEPT Provider Note   CSN: [id number] Arrival date & time: 10/19/19  1749     History Chief Complaint  Patient presents with  . Fall  . Leg Injury    Stacey Mason is a 59 y.o. female.   59 year old female presents to the emergency department following a fall.  She was getting out of the bathtub when she slipped and fell.  Reports half of her body was in the bathtub and the other half was between the tub and the toilet.  She struck her left leg against the toilet and has been having ongoing pain since the fall.  She has not tried to ambulate as she has been hesitant of her ongoing discomfort.  No medications taken prior to arrival.  Patient reporting associated swelling.  No numbness or extremity weakness.  She is not on chronic anticoagulation.  The history is provided by the patient. No language interpreter was used.  Fall       Past Medical History:  Diagnosis Date  . Abnormal uterine bleeding (AUB)   . Cardiac murmur   . Hyperlipidemia   . Hypertension   . Thyroid disease     Patient Active Problem List   Diagnosis Date Noted  . Dermatitis 11/06/2018  . Post-menopausal bleeding 11/06/2018  . HTN (hypertension) 02/26/2013  . Hypothyroidism 02/26/2013  . Hyperglycemia 02/26/2013  . Hyperlipidemia 02/26/2013    Past Surgical History:  Procedure Laterality Date  . BREAST SURGERY       OB History   No obstetric history on file.     Family History  Problem Relation Age of Onset  . Diabetes Father   . Hyperlipidemia Father   . Hypertension Father     Social History   Tobacco Use  . Smoking status: Never Smoker  . Smokeless tobacco: Never Used  Substance Use Topics  . Alcohol use: No  . Drug use: No    Home Medications Prior to Admission medications   Medication Sig Start Date End Date Taking? Authorizing Provider  amLODipine (NORVASC) 10 MG tablet Take 1 tablet (10 mg total) by mouth daily. 05/28/19    Gildardo Pounds, NP  aspirin 325 MG tablet Take 325 mg by mouth daily.    [provider]  levothyroxine (SYNTHROID) 175 MCG tablet Take 1 tablet (175 mcg total) by mouth daily before breakfast. 06/07/19   Gildardo Pounds, NP  lisinopril (ZESTRIL) 10 MG tablet Take 1 tablet (10 mg total) by mouth daily. 05/28/19 08/26/19  Gildardo Pounds, NP  naproxen (NAPROSYN) 500 MG tablet Take 1 tablet (500 mg total) by mouth every 12 (twelve) hours as needed for mild pain or moderate pain. 10/20/19   Antonietta Breach, PA-C  pravastatin (PRAVACHOL) 20 MG tablet Take 1 tablet (20 mg total) by mouth daily. 05/28/19   Gildardo Pounds, NP  triamcinolone cream (KENALOG) 0.1 % Apply 1 application topically 2 (two) times daily. Patient not taking: Reported on 05/28/2019 11/06/18   Ladell Pier, MD    Allergies    Patient has no known allergies.  Review of Systems   Review of Systems  Ten systems reviewed and are negative for acute change, except as noted in the HPI.    Physical Exam Updated Vital Signs BP (!) 183/118 (BP Location: Left Arm)   Pulse (!) 104   Temp 98.4 F (36.9 C) (Oral)   Resp 18   Ht 5\' 6"  (1.676 m)   Wt 90.7 kg  SpO2 99%   BMI 32.28 kg/m   Physical Exam Vitals and nursing note reviewed.  Constitutional:      General: She is not in acute distress.    Appearance: She is well-developed. She is not diaphoretic.     Comments: Obese, nontoxic female  HENT:     Head: Normocephalic and atraumatic.  Eyes:     General: No scleral icterus.    Conjunctiva/sclera: Conjunctivae normal.  Pulmonary:     Effort: Pulmonary effort is normal. No respiratory distress.     Comments: Respirations even and unlabored Musculoskeletal:        General: Normal range of motion.     Cervical back: Normal range of motion.       Legs:  Skin:    General: Skin is warm and dry.     Coloration: Skin is not pale.     Findings: No erythema or rash.  Neurological:     General: No focal deficit  present.     Mental Status: She is alert and oriented to person, place, and time.     Coordination: Coordination normal.     Comments: Moving all extremities spontaneously.  Psychiatric:        Behavior: Behavior normal.     ED Results / Procedures / Treatments   Labs (all labs ordered are listed, but only abnormal results are displayed) Labs Reviewed - No data to display  EKG None  Radiology DG FEMUR MIN 2 VIEWS LEFT  Result Date: 10/19/2019 CLINICAL DATA:  Status post fall. EXAM: LEFT FEMUR 2 VIEWS COMPARISON:  None. FINDINGS: There is no evidence of fracture or other focal bone lesions. Soft tissues are unremarkable. IMPRESSION: Negative. Electronically Signed   By: Virgina Norfolk M.D.   On: 10/19/2019 18:36    Procedures Procedures (including critical care time)  Medications Ordered in ED Medications - No data to display  ED Course  I have reviewed the triage vital signs and the nursing notes.  Pertinent labs & imaging results that were available during my care of the patient were reviewed by me and considered in my medical decision making (see chart for details).    MDM Rules/Calculators/A&P                       Patient presents to the emergency department for evaluation of L thigh pain after a mechanical fall. Patient neurovascularly intact on exam. Imaging negative for fracture, dislocation, bony deformity. Compartments in the affected extremity are soft. Plan for supportive management including RICE and NSAIDs; primary care follow up as needed. Return precautions discussed and provided. Patient discharged in stable condition with no unaddressed concerns.   Final Clinical Impression(s) / ED Diagnoses Final diagnoses:  Contusion of left thigh, initial encounter    Rx / DC Orders ED Discharge Orders         Ordered    naproxen (NAPROSYN) 500 MG tablet  Every 12 hours PRN     10/20/19 0420           Antonietta Breach, PA-C 10/20/19 Miquel Dunn, MD 10/20/19 0600

## 2019-10-20 NOTE — Discharge Instructions (Signed)
Apply ice to areas of injury 3-4 times per day to limit inflammation.  Avoid strenuous activity and heavy lifting.  We recommend consistent use of naproxen for pain as prescribed.  Follow-up with a primary care doctor to ensure resolution of symptoms.  Return to the ED for any new or concerning symptoms.

## 2019-10-21 ENCOUNTER — Encounter: Payer: Self-pay | Admitting: Obstetrics & Gynecology

## 2019-11-29 ENCOUNTER — Telehealth: Payer: Self-pay | Admitting: General Practice

## 2019-11-29 DIAGNOSIS — I10 Essential (primary) hypertension: Secondary | ICD-10-CM

## 2019-11-29 MED ORDER — LISINOPRIL 10 MG PO TABS: 10.0000 mg | ORAL_TABLET | Freq: Every day | ORAL | 0 refills | Status: DC

## 2019-11-29 MED ORDER — LEVOTHYROXINE SODIUM 175 MCG PO TABS: 175.0000 ug | ORAL_TABLET | Freq: Every day | ORAL | 0 refills | Status: DC

## 2019-11-29 NOTE — Telephone Encounter (Signed)
1) Medication(s) Requested (by name): -lisinopril (ZESTRIL) 10 MG tablet -levothyroxine (SYNTHROID) 175 MCG tablet   2) Pharmacy of Choice: -[facility] [address] [place], [place].  3) Special Requests:

## 2019-11-29 NOTE — Telephone Encounter (Signed)
RF sent.

## 2019-12-01 ENCOUNTER — Encounter: Payer: Self-pay | Admitting: Obstetrics & Gynecology

## 2019-12-03 ENCOUNTER — Encounter: Payer: Self-pay | Admitting: Obstetrics & Gynecology

## 2019-12-14 ENCOUNTER — Ambulatory Visit: Payer: Self-pay | Attending: Nurse Practitioner | Admitting: Nurse Practitioner

## 2019-12-14 ENCOUNTER — Other Ambulatory Visit: Payer: Self-pay

## 2020-02-14 ENCOUNTER — Telehealth: Payer: Self-pay | Admitting: Nurse Practitioner

## 2020-02-14 NOTE — Telephone Encounter (Signed)
Patients pharmacy called in and requested for listed medications to be refilled. Ad sent to [facility].  levothyroxine (SYNTHROID) 175 MCG tablet [id number]  lisinopril (ZESTRIL) 10 MG tablet [id number] ENDED

## 2020-02-21 NOTE — Telephone Encounter (Signed)
Spoke to patient's daughter and informed she needs to have labs done or Appt. With PCP. Pt's daughter informed that the patient is currently in [place] and would not be able to come back till July. Pt. Daughter was informed the medication request was denied.  Pt's daughter was informed that she can have her mother establish care w/ a PCP in [place] while she is there, and when she return, she can schedule an appt. W/ PCP.

## 2020-02-23 ENCOUNTER — Other Ambulatory Visit: Payer: Self-pay

## 2020-02-23 ENCOUNTER — Ambulatory Visit: Payer: Self-pay | Attending: Nurse Practitioner | Admitting: Physician Assistant

## 2020-02-23 NOTE — Progress Notes (Deleted)
Virtual Visit via Telephone Note  I connected with Stacey Mason on 02/23/20 at  1:30 PM EDT by telephone and verified that I am speaking with the correct person using two identifiers.   I discussed the limitations, risks, security and privacy concerns of performing an evaluation and management service by telephone and the availability of in person appointments. I also discussed with the patient that there may be a patient responsible charge related to this service. The patient expressed understanding and agreed to proceed.   PATIENT visit by telephone virtually in the context of Covid-19 pandemic. Patient location: My Location:  [place] office Persons on the call:  History of Present Illness:    Observations/Objective:   Assessment and Plan:   Follow Up Instructions:    I discussed the assessment and treatment plan with the patient. The patient was provided an opportunity to ask questions and all were answered. The patient agreed with the plan and demonstrated an understanding of the instructions.   The patient was advised to call back or seek an in-person evaluation if the symptoms worsen or if the condition fails to improve as anticipated.  I provided *** minutes of non-face-to-face time during this encounter.   Freeman Caldron, PA-C  Patient ID: Stacey Mason, female   DOB: 05-16-1961, 59 y.o.   MRN: [id number]

## 2020-08-01 IMAGING — US US PELVIS COMPLETE
1 series · 14 of 25 positions shown · non-contrast
Comparison: None

CLINICAL DATA: Postmenopausal bleeding

EXAM:
TRANSABDOMINAL ULTRASOUND OF PELVIS
TECHNIQUE: Transabdominal ultrasound examination of the pelvis was performed
including evaluation of the uterus, ovaries, adnexal regions, and
pelvic cul-de-sac. Transvaginal imaging was not ordered.

[Series 1: us pelvis complete · 0.24mm/px · 14 of 30 slices shown]
[im 1/30]
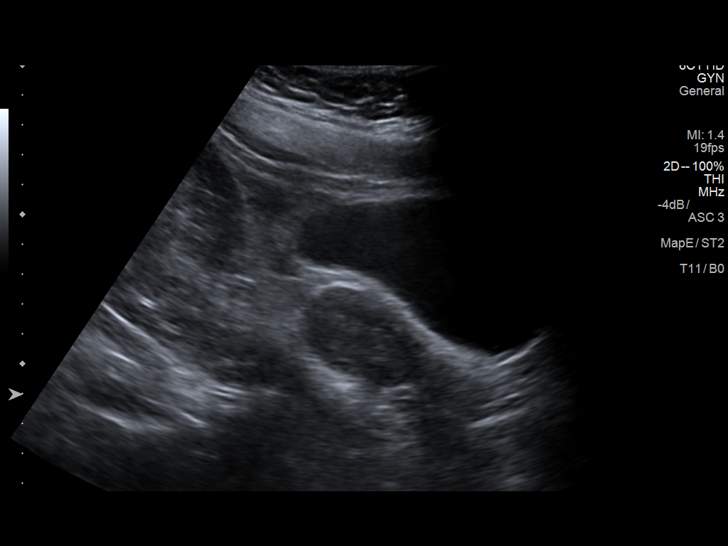
[im 3/30]
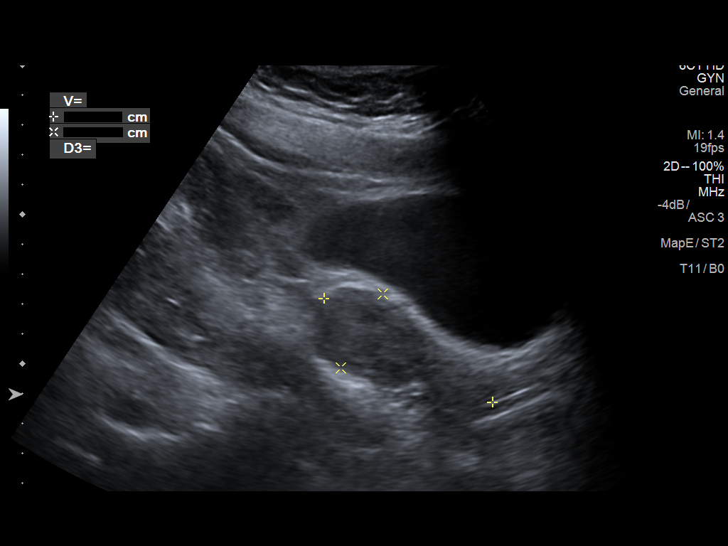
[im 5/30]
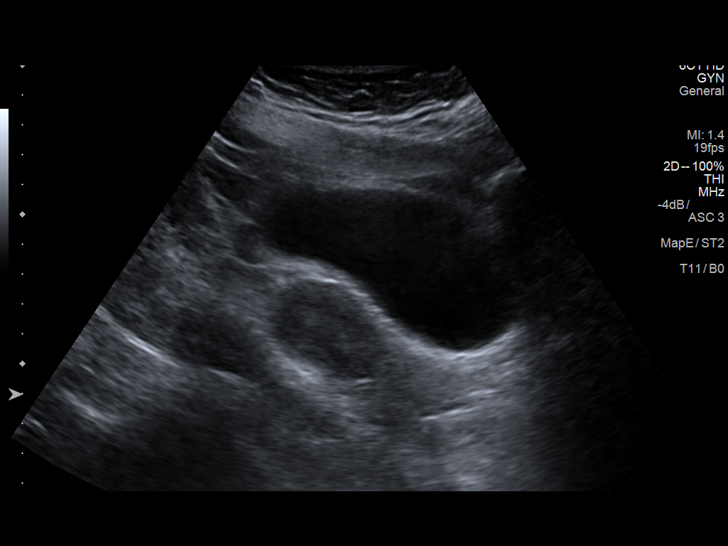
[im 8/30]
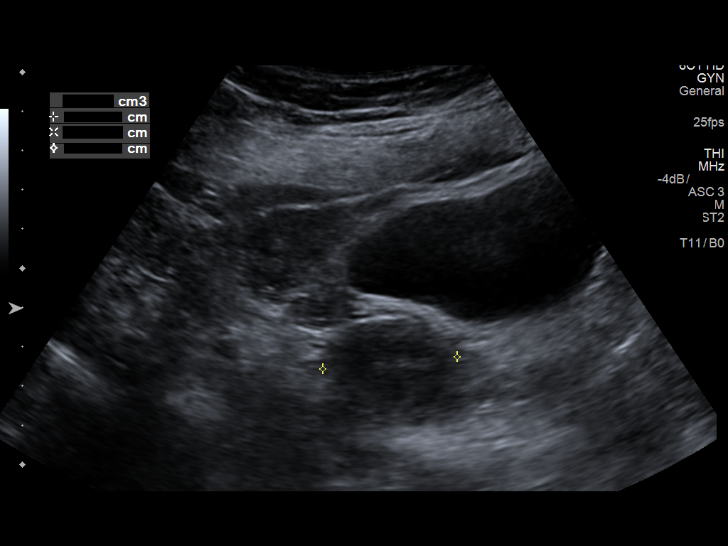
[im 10/30]
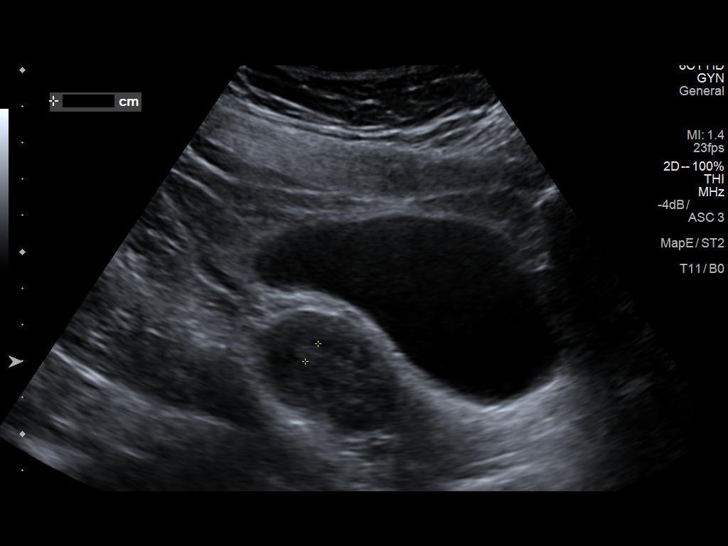
[im 11/30]
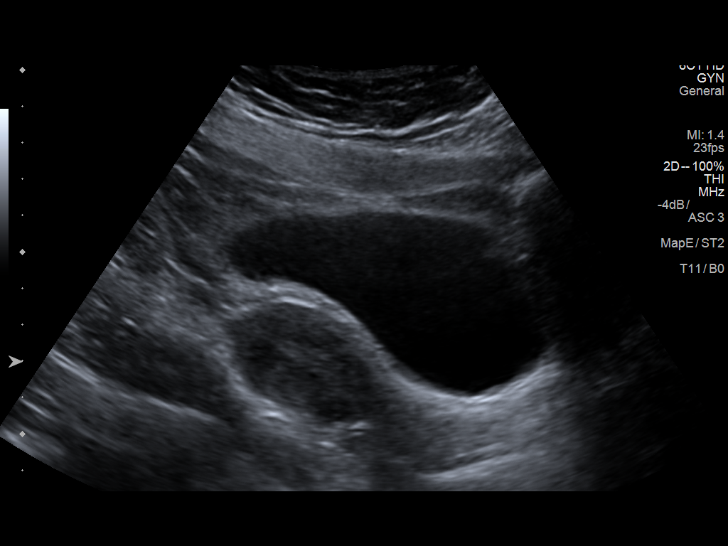
[im 14/30]
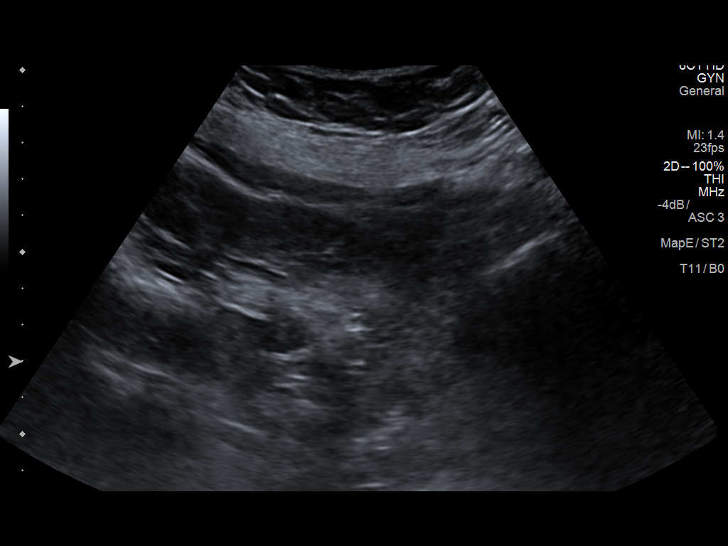
[im 16/30]
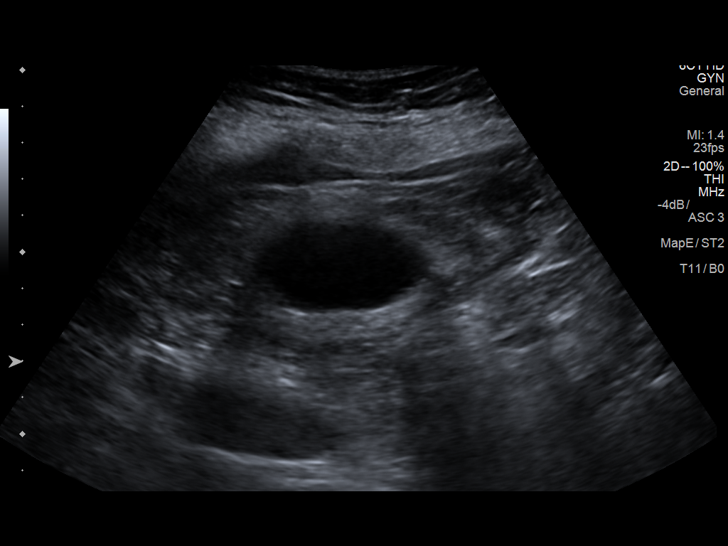
[im 19/30]
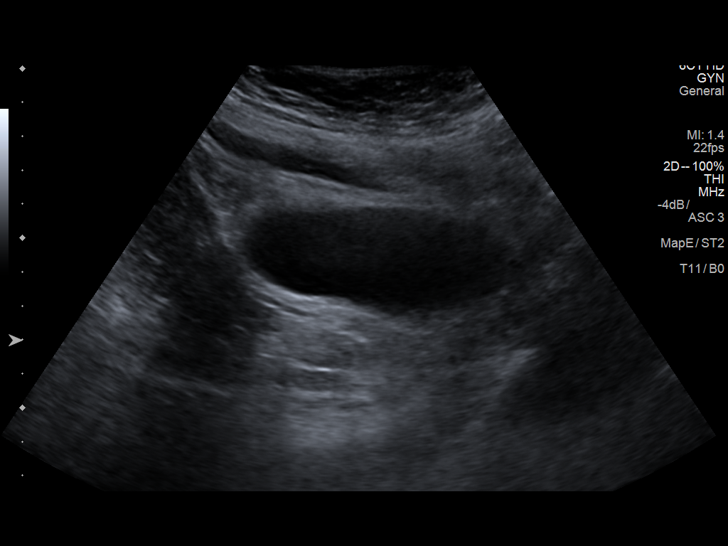
[im 20/30]
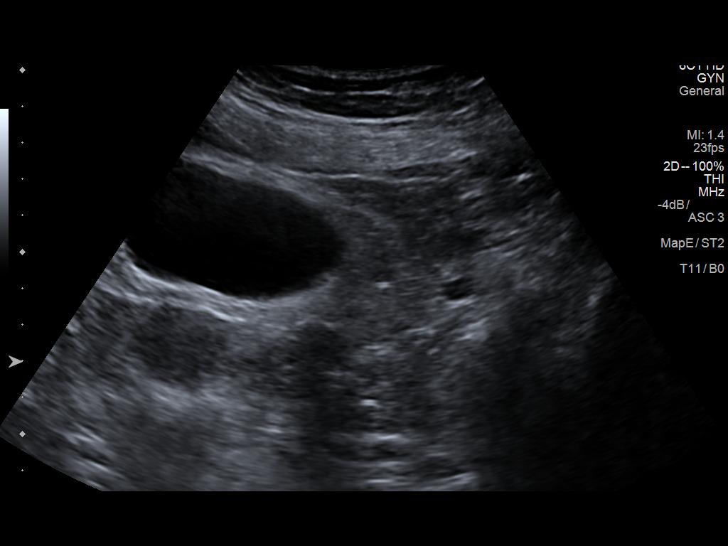
[im 22/30]
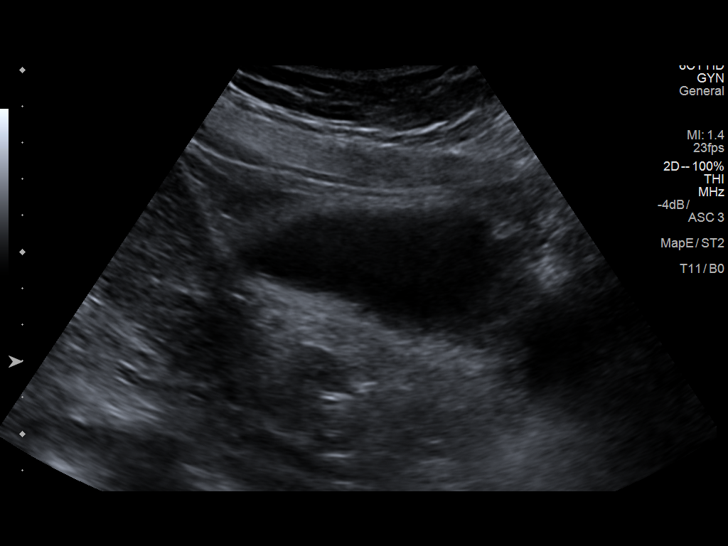
[im 25/30]
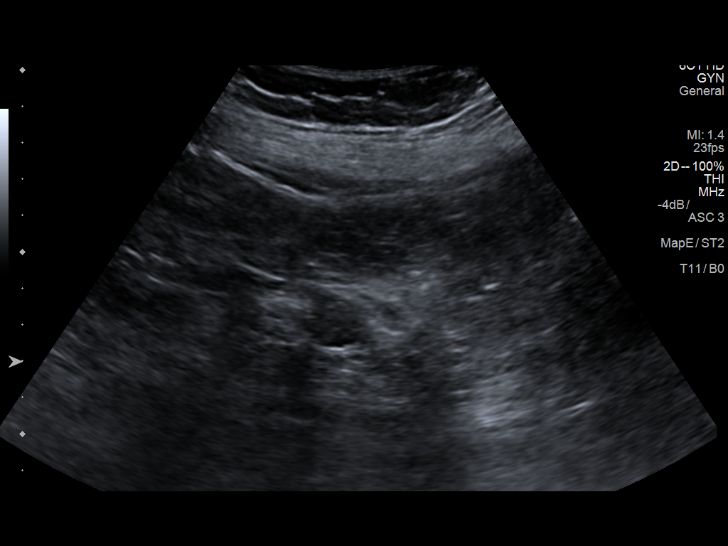
[im 27/30]
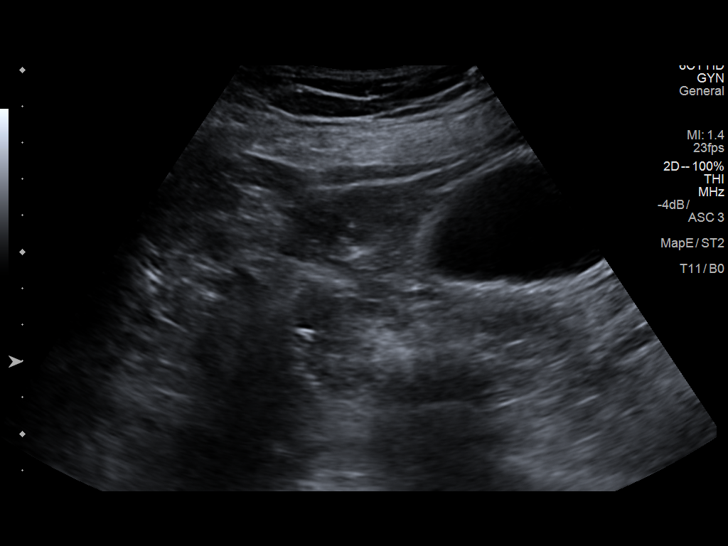
[im 30/30]
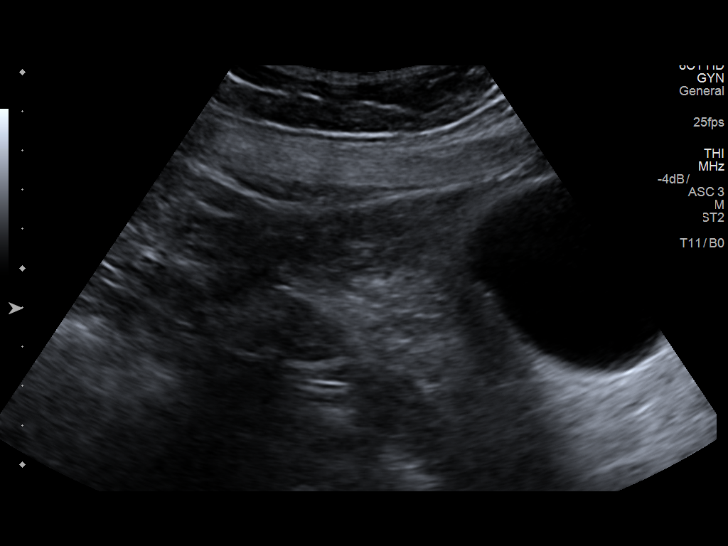

[14 of 25 positions shown; findings below may reference images not displayed]

FINDINGS: Uterus

Measurements: 6.6 x 2.8 x 3.4 cm = volume: 33.9 mL. Normal
morphology without mass

Endometrium

Thickness: 6 mm.  No endometrial fluid or focal abnormality

Right ovary

Measurements: 1.8 x 1.5 x 1.5 cm = volume: 2.1 mL. Normal morphology
without mass

Left ovary

Measurements: 2.1 x 1.4 x 1.2 cm = volume: 1.8 mL. Normal morphology
without mass

Other findings:  No free pelvic fluid or adnexal masses.
IMPRESSION: 6 mm thick endometrial complex, abnormal for a postmenopausal
patient with bleeding.

In the setting of post-menopausal bleeding, endometrial sampling is
indicated to exclude carcinoma. If results are benign,
sonohysterogram should be considered for focal lesion work-up. (Ref:
Radiological Reasoning: Algorithmic Workup of Abnormal Vaginal
Bleeding with Endovaginal Sonography and Sonohysterography. AJR
1008; 191:[id number])

## 2020-08-14 ENCOUNTER — Other Ambulatory Visit: Payer: Self-pay | Admitting: Nurse Practitioner

## 2020-08-14 DIAGNOSIS — I10 Essential (primary) hypertension: Secondary | ICD-10-CM

## 2020-08-14 NOTE — Telephone Encounter (Signed)
Medication: Apt 09/19/20- levothyroxine (SYNTHROID) 175 MCG tablet [[id number]] , lisinopril (ZESTRIL) 10 MG tablet [[id number]]  ENDED  Has the patient contacted their pharmacy? YES  (Agent: If no, request that the patient contact the pharmacy for the refill.) (Agent: If yes, when and what did the pharmacy advise?)  Preferred Pharmacy (with phone number or street name): [facility]  Agent: Please be advised that RX refills may take up to 3 business days. We ask that you follow-up with your pharmacy.

## 2020-08-14 NOTE — Telephone Encounter (Signed)
Requested medication (s) are due for refill today: yes  Requested medication (s) are on the active medication list: levothyroxine: yes    lisinopril: expired 12/29/19  Last refill:  levothyroxine: 11/29/19   lisinopril: 11/29/19  Future visit scheduled: yes  Notes to clinic:  overdue labs/ expired lisinopril   Requested Prescriptions  Pending Prescriptions Disp Refills   levothyroxine (SYNTHROID) 175 MCG tablet 30 tablet 0    Sig: Take 1 tablet (175 mcg total) by mouth daily before breakfast.      Endocrinology:  Hypothyroid Agents Failed - 08/14/2020 12:12 PM      Failed - TSH needs to be rechecked within 3 months after an abnormal result. Refill until TSH is due.      Failed - TSH in normal range and within 360 days    TSH  Date Value Ref Range Status  06/04/2019 11.100 (H) 0.450 - 4.500 uIU/mL Final          Failed - Valid encounter within last 12 months    Recent Outpatient Visits           5 months ago Hypothyroidism, unspecified type   [facility]   1 year ago Essential hypertension   [place], Vernia Buff, NP   1 year ago No-show for appointment   [place], MD   1 year ago Essential hypertension   [place], [place], [place]   1 year ago Post-menopausal bleeding   [place], MD       Future Appointments             In 1 month Gildardo Pounds, NP [place]              lisinopril (ZESTRIL) 10 MG tablet 30 tablet 0    Sig: Take 1 tablet (10 mg total) by mouth daily.      Cardiovascular:  ACE Inhibitors Failed - 08/14/2020 12:12 PM      Failed - Cr in normal range and within 180 days    Creat  Date Value Ref Range Status  10/25/2014 0.66 0.50 - 1.10 mg/dL Final   Creatinine, Ser  Date Value Ref Range Status   06/04/2019 0.81 0.57 - 1.00 mg/dL Final          Failed - K in normal range and within 180 days    Potassium  Date Value Ref Range Status  06/04/2019 4.0 3.5 - 5.2 mmol/L Final          Failed - Last BP in normal range    BP Readings from Last 1 Encounters:  10/20/19 (!) 183/118          Failed - Valid encounter within last 6 months    Recent Outpatient Visits           5 months ago Hypothyroidism, unspecified type   [place]   1 year ago Essential hypertension   [place], Vernia Buff, NP   1 year ago No-show for appointment   [place], Deborah B, MD   1 year ago Essential hypertension   [place], [place]   1 year ago Post-menopausal bleeding   [place]  Ladell Pier, MD       Future Appointments             In 1 month Gildardo Pounds, NP [place] - Patient is not pregnant

## 2020-08-15 ENCOUNTER — Other Ambulatory Visit: Payer: Self-pay | Admitting: Family Medicine

## 2020-08-15 MED ORDER — LISINOPRIL 10 MG PO TABS: 10.0000 mg | ORAL_TABLET | Freq: Every day | ORAL | 0 refills | Status: DC

## 2020-08-15 MED ORDER — LEVOTHYROXINE SODIUM 175 MCG PO TABS: 175.0000 ug | ORAL_TABLET | Freq: Every day | ORAL | 0 refills | Status: DC

## 2020-08-15 MED FILL — LEVOTHYROXINE SODIUM 175 MC: 175 | 30 days supply | Qty: 30 | Fill #0

## 2020-08-15 MED FILL — LISINOPRIL 10 MG TABS: 10 | 30 days supply | Qty: 30 | Fill #0

## 2020-09-19 ENCOUNTER — Ambulatory Visit: Payer: Self-pay | Admitting: Nurse Practitioner

## 2020-11-13 ENCOUNTER — Ambulatory Visit: Payer: Self-pay | Admitting: Nurse Practitioner

## 2021-01-01 ENCOUNTER — Ambulatory Visit: Payer: 59 | Attending: Nurse Practitioner | Admitting: Nurse Practitioner

## 2021-01-01 ENCOUNTER — Other Ambulatory Visit: Payer: Self-pay

## 2021-01-01 ENCOUNTER — Other Ambulatory Visit: Payer: Self-pay | Admitting: Nurse Practitioner

## 2021-01-01 ENCOUNTER — Encounter: Payer: Self-pay | Admitting: Nurse Practitioner

## 2021-01-01 VITALS — BP 164/98 | HR 99 | Resp 20 | Ht 66.0 in | Wt 211.0 lb

## 2021-01-01 DIAGNOSIS — Z1211 Encounter for screening for malignant neoplasm of colon: Secondary | ICD-10-CM

## 2021-01-01 DIAGNOSIS — I10 Essential (primary) hypertension: Secondary | ICD-10-CM | POA: Diagnosis not present

## 2021-01-01 DIAGNOSIS — G8929 Other chronic pain: Secondary | ICD-10-CM

## 2021-01-01 DIAGNOSIS — Z1231 Encounter for screening mammogram for malignant neoplasm of breast: Secondary | ICD-10-CM

## 2021-01-01 DIAGNOSIS — E7800 Pure hypercholesterolemia, unspecified: Secondary | ICD-10-CM

## 2021-01-01 DIAGNOSIS — M25561 Pain in right knee: Secondary | ICD-10-CM | POA: Diagnosis not present

## 2021-01-01 DIAGNOSIS — Z114 Encounter for screening for human immunodeficiency virus [HIV]: Secondary | ICD-10-CM

## 2021-01-01 DIAGNOSIS — E039 Hypothyroidism, unspecified: Secondary | ICD-10-CM

## 2021-01-01 DIAGNOSIS — R7989 Other specified abnormal findings of blood chemistry: Secondary | ICD-10-CM

## 2021-01-01 DIAGNOSIS — M25562 Pain in left knee: Secondary | ICD-10-CM

## 2021-01-01 DIAGNOSIS — R7303 Prediabetes: Secondary | ICD-10-CM

## 2021-01-01 DIAGNOSIS — Z1159 Encounter for screening for other viral diseases: Secondary | ICD-10-CM

## 2021-01-01 MED ORDER — LEVOTHYROXINE SODIUM 175 MCG PO TABS: 175.0000 ug | ORAL_TABLET | Freq: Every day | ORAL | 2 refills | Status: DC

## 2021-01-01 MED ORDER — NAPROXEN 500 MG PO TABS: 500.0000 mg | ORAL_TABLET | Freq: Two times a day (BID) | ORAL | 1 refills | Status: DC | PRN

## 2021-01-01 MED ORDER — AMLODIPINE BESYLATE 10 MG PO TABS: 10.0000 mg | ORAL_TABLET | Freq: Every day | ORAL | 3 refills | Status: DC

## 2021-01-01 MED ORDER — LISINOPRIL 10 MG PO TABS: 10.0000 mg | ORAL_TABLET | Freq: Every day | ORAL | 0 refills | Status: DC

## 2021-01-01 MED ORDER — PRAVASTATIN SODIUM 20 MG PO TABS: 20.0000 mg | ORAL_TABLET | Freq: Every day | ORAL | 3 refills | Status: DC

## 2021-01-01 MED FILL — LISINOPRIL 10 MG TABS: 10 | 90 days supply | Qty: 90 | Fill #0

## 2021-01-01 MED FILL — PRAVASTATIN SODIUM 20 MG TA: 20 | 90 days supply | Qty: 90 | Fill #0

## 2021-01-01 MED FILL — NAPROXEN 500 MG TABLET: 500 | 30 days supply | Qty: 60 | Fill #0

## 2021-01-01 MED FILL — LEVOTHYROXINE SODIUM 175 MC: 175 | 30 days supply | Qty: 30 | Fill #0

## 2021-01-01 MED FILL — AMLODIPINE BESYLATE 10 MG T: 10 | 90 days supply | Qty: 90 | Fill #0

## 2021-01-01 NOTE — Progress Notes (Signed)
Assessment & Plan:  Stacey Mason was seen today for hypertension and medication refill.  Diagnoses and all orders for this visit:  Primary hypertension -     CMP14+EGFR -     lisinopril (ZESTRIL) 10 MG tablet; Take 1 tablet (10 mg total) by mouth daily. -     amLODipine (NORVASC) 10 MG tablet; Take 1 tablet (10 mg total) by mouth daily. Continue all antihypertensives as prescribed.  Remember to bring in your blood pressure log with you for your follow up appointment.  DASH/Mediterranean Diets are healthier choices for HTN.    Pure hypercholesterolemia -     Lipid panel -     pravastatin (PRAVACHOL) 20 MG tablet; Take 1 tablet (20 mg total) by mouth daily. INSTRUCTIONS: Work on a low fat, heart healthy diet and participate in regular aerobic exercise program by working out at least 150 minutes per week; 5 days a week-30 minutes per day. Avoid red meat/beef/steak,  fried foods. junk foods, sodas, sugary drinks, unhealthy snacking, alcohol and smoking.  Drink at least 80 oz of water per day and monitor your carbohydrate intake daily.    Chronic pain of both knees -     naproxen (NAPROSYN) 500 MG tablet; Take 1 tablet (500 mg total) by mouth every 12 (twelve) hours as needed for mild pain or moderate pain.  Prediabetes -     Hemoglobin A1c Continue blood sugar control as discussed in office today, low carbohydrate diet, and regular physical exercise as tolerated, 150 minutes per week (30 min each day, 5 days per week, or 50 min 3 days per week).    Hypothyroidism, unspecified type -     TSH -     levothyroxine (SYNTHROID) 175 MCG tablet; Take 1 tablet (175 mcg total) by mouth daily before breakfast.  Breast cancer screening by mammogram -     MM 3D SCREEN BREAST BILATERAL; Future  Colon cancer screening -     Ambulatory referral to Gastroenterology -     Cancel: Fecal occult blood, imunochemical  Need for hepatitis C screening test -     HCV Ab w Reflex to Quant PCR  Encounter for  screening for HIV -     HIV antibody (with reflex)  Abnormal CBC -     CBC    Patient has been counseled on age-appropriate routine health concerns for screening and prevention. These are reviewed and up-to-date. Referrals have been placed accordingly. Immunizations are up-to-date or declined.    Subjective:   Chief Complaint  Patient presents with  . Hypertension  . Medication Refill   HPI Stacey Mason 60 y.o. female presents to office today for follow up.  She has a past medical history of Abnormal uterine bleeding, Cardiac murmur, Hyperlipidemia, Hypertension, and Thyroid disease.   Hypothyroidism She has been out of synthroid from quite some time now. Denies any symptoms of hypo of hyperthyroidism Lab Results  Component Value Date   TSH 11.100 (H) 06/04/2019    Essential Hypertension Poorly controlled. Murmur noted on exam today. She is taking amlodipine 10 mg daily and lisinopril 10 mg daily. Denies chest pain, palpitations, lightheadedness, dizziness, headaches or BLE edema. She does endorse shortness of breath with exertion.  BP Readings from Last 3 Encounters:  01/01/21 (!) 164/98  10/20/19 (!) 183/118  11/25/18 (!) 161/102     Dyslipidemia Not at goal. She has been out of her pravastatin for several months.  Lab Results  Component Value Date   LDLCALC 156 (  H) 06/04/2019   Prediabetes Controlled with diet only at this time.  Lab Results  Component Value Date   HGBA1C 5.9 (H) 10/25/2014   Review of Systems  Constitutional: Negative for fever, malaise/fatigue and weight loss.  HENT: Negative.  Negative for nosebleeds.   Eyes: Negative.  Negative for blurred vision, double vision and photophobia.  Respiratory: Positive for shortness of breath. Negative for cough and wheezing.   Cardiovascular: Negative.  Negative for chest pain, palpitations and leg swelling.  Gastrointestinal: Negative.  Negative for heartburn, nausea and vomiting.  Genitourinary:  Negative.   Musculoskeletal: Positive for joint pain. Negative for myalgias.  Neurological: Negative.  Negative for dizziness, focal weakness, seizures and headaches.  Psychiatric/Behavioral: Negative.  Negative for suicidal ideas.    Past Medical History:  Diagnosis Date  . Abnormal uterine bleeding (AUB)   . Cardiac murmur   . Hyperlipidemia   . Hypertension   . Thyroid disease     Past Surgical History:  Procedure Laterality Date  . BREAST SURGERY      Family History  Problem Relation Age of Onset  . Diabetes Father   . Hyperlipidemia Father   . Hypertension Father     Social History Reviewed with no changes to be made today.   Outpatient Medications Prior to Visit  Medication Sig Dispense Refill  . levothyroxine (SYNTHROID) 175 MCG tablet Take 1 tablet (175 mcg total) by mouth daily before breakfast. 30 tablet 0  . triamcinolone cream (KENALOG) 0.1 % Apply 1 application topically 2 (two) times daily. (Patient not taking: No sig reported) 30 g 1  . amLODipine (NORVASC) 10 MG tablet Take 1 tablet (10 mg total) by mouth daily. (Patient not taking: Reported on 01/01/2021) 90 tablet 3  . aspirin 325 MG tablet Take 325 mg by mouth daily. (Patient not taking: Reported on 01/01/2021)    . lisinopril (ZESTRIL) 10 MG tablet Take 1 tablet (10 mg total) by mouth daily. 30 tablet 0  . naproxen (NAPROSYN) 500 MG tablet Take 1 tablet (500 mg total) by mouth every 12 (twelve) hours as needed for mild pain or moderate pain. (Patient not taking: Reported on 01/01/2021) 30 tablet 0  . pravastatin (PRAVACHOL) 20 MG tablet Take 1 tablet (20 mg total) by mouth daily. (Patient not taking: Reported on 01/01/2021) 90 tablet 3   No facility-administered medications prior to visit.    No Known Allergies     Objective:    BP (!) 164/98   Pulse 99   Resp 20   Ht 5' 6"  (1.676 m)   Wt 211 lb (95.7 kg)   SpO2 99%   BMI 34.06 kg/m  Wt Readings from Last 3 Encounters:  01/01/21 211 lb (95.7 kg)   10/19/19 200 lb (90.7 kg)  11/25/18 200 lb (90.7 kg)    Physical Exam Vitals and nursing note reviewed.  Constitutional:      Appearance: She is well-developed.  HENT:     Head: Normocephalic and atraumatic.  Cardiovascular:     Rate and Rhythm: Normal rate and regular rhythm.     Heart sounds: Murmur heard.  No friction rub. No gallop.   Pulmonary:     Effort: Pulmonary effort is normal. No tachypnea or respiratory distress.     Breath sounds: Normal breath sounds. No decreased breath sounds, wheezing, rhonchi or rales.  Chest:     Chest wall: No tenderness.  Abdominal:     General: Bowel sounds are normal.     Palpations:  Abdomen is soft.  Musculoskeletal:        General: Normal range of motion.     Cervical back: Normal range of motion.  Skin:    General: Skin is warm and dry.  Neurological:     Mental Status: She is alert and oriented to person, place, and time.     Coordination: Coordination normal.  Psychiatric:        Behavior: Behavior normal. Behavior is cooperative.        Thought Content: Thought content normal.        Judgment: Judgment normal.          Patient has been counseled extensively about nutrition and exercise as well as the importance of adherence with medications and regular follow-up. The patient was given clear instructions to go to ER or return to medical center if symptoms don't improve, worsen or new problems develop. The patient verbalized understanding.   Follow-up: Return for [facility] in 4 weeks. Repeat TSH. See me for PAP and EKG.   Gildardo Pounds, FNP-BC [facility] [place], [place]   01/01/2021, 4:14 PM

## 2021-03-02 DIAGNOSIS — M545 Low back pain, unspecified: Secondary | ICD-10-CM | POA: Insufficient documentation

## 2021-03-13 ENCOUNTER — Other Ambulatory Visit: Payer: Self-pay | Admitting: Sports Medicine

## 2021-03-13 DIAGNOSIS — M545 Low back pain, unspecified: Secondary | ICD-10-CM

## 2021-03-22 ENCOUNTER — Other Ambulatory Visit: Payer: Self-pay

## 2021-03-22 ENCOUNTER — Ambulatory Visit
Admission: RE | Admit: 2021-03-22 | Discharge: 2021-03-22 | Disposition: A | Discharge status: Home / Self Care | Payer: 59 | Source: Ambulatory Visit | Attending: Sports Medicine | Admitting: Sports Medicine

## 2021-03-22 DIAGNOSIS — M545 Low back pain, unspecified: Secondary | ICD-10-CM

## 2021-04-06 ENCOUNTER — Other Ambulatory Visit: Payer: Self-pay | Admitting: Nurse Practitioner

## 2021-04-06 DIAGNOSIS — I10 Essential (primary) hypertension: Secondary | ICD-10-CM

## 2021-04-06 NOTE — Telephone Encounter (Signed)
Pt called in to request a refill for 2 medications.    lisinopril (ZESTRIL) 10 MG tablet   levothyroxine (SYNTHROID) 175 MCG tablet   Pharmacy:  [id number] [place], [place]. AT [address] Phone:  [phone]  Fax:  [phone]

## 2021-04-07 NOTE — Telephone Encounter (Signed)
   Last visit 01/01/21   Future visit scheduled No  Notes to clinic Last TSH 05/2019, visit 01/01/21 Labs ordered, not drawn, pt asked to return in 4 wks to see Lurena Joiner, and to see MD for Pap and EKG. She has not returned and there is no upcoming visit scheduled.

## 2021-04-11 ENCOUNTER — Inpatient Hospital Stay: Admission: RE | Admit: 2021-04-11 | Payer: 59 | Source: Ambulatory Visit

## 2021-04-15 ENCOUNTER — Other Ambulatory Visit: Payer: Self-pay

## 2021-04-15 ENCOUNTER — Emergency Department (HOSPITAL_COMMUNITY)
Admission: EM | Admit: 2021-04-15 | Discharge: 2021-04-15 | Disposition: A | Discharge status: Home / Self Care | Payer: 59 | Attending: Emergency Medicine | Admitting: Emergency Medicine

## 2021-04-15 ENCOUNTER — Emergency Department (HOSPITAL_COMMUNITY): Payer: 59

## 2021-04-15 ENCOUNTER — Encounter (HOSPITAL_COMMUNITY): Payer: Self-pay | Admitting: Emergency Medicine

## 2021-04-15 DIAGNOSIS — S8990XA Unspecified injury of unspecified lower leg, initial encounter: Secondary | ICD-10-CM

## 2021-04-15 DIAGNOSIS — S80212A Abrasion, left knee, initial encounter: Secondary | ICD-10-CM | POA: Insufficient documentation

## 2021-04-15 DIAGNOSIS — S8991XA Unspecified injury of right lower leg, initial encounter: Secondary | ICD-10-CM | POA: Diagnosis present

## 2021-04-15 DIAGNOSIS — T1490XA Injury, unspecified, initial encounter: Secondary | ICD-10-CM

## 2021-04-15 DIAGNOSIS — S80211A Abrasion, right knee, initial encounter: Secondary | ICD-10-CM | POA: Insufficient documentation

## 2021-04-15 DIAGNOSIS — I10 Essential (primary) hypertension: Secondary | ICD-10-CM | POA: Diagnosis not present

## 2021-04-15 DIAGNOSIS — E039 Hypothyroidism, unspecified: Secondary | ICD-10-CM | POA: Insufficient documentation

## 2021-04-15 DIAGNOSIS — S92911A Unspecified fracture of right toe(s), initial encounter for closed fracture: Secondary | ICD-10-CM

## 2021-04-15 DIAGNOSIS — W010XXA Fall on same level from slipping, tripping and stumbling without subsequent striking against object, initial encounter: Secondary | ICD-10-CM | POA: Diagnosis not present

## 2021-04-15 DIAGNOSIS — S92511A Displaced fracture of proximal phalanx of right lesser toe(s), initial encounter for closed fracture: Secondary | ICD-10-CM | POA: Insufficient documentation

## 2021-04-15 DIAGNOSIS — Z79899 Other long term (current) drug therapy: Secondary | ICD-10-CM | POA: Insufficient documentation

## 2021-04-15 MED ORDER — OXYCODONE-ACETAMINOPHEN 5-325 MG PO TABS
1.0000 | ORAL_TABLET | Freq: Once | ORAL | Status: AC
  Administered 2021-04-15: 1 via ORAL
  Filled 2021-04-15: qty 1

## 2021-04-15 MED ORDER — ONDANSETRON 4 MG PO TBDP
4.0000 mg | ORAL_TABLET | Freq: Once | ORAL | Status: AC
  Administered 2021-04-15: 4 mg via ORAL
  Filled 2021-04-15: qty 1

## 2021-04-15 MED ORDER — ACETAMINOPHEN 325 MG PO TABS
650.0000 mg | ORAL_TABLET | Freq: Once | ORAL | Status: AC
  Administered 2021-04-15: 650 mg via ORAL
  Filled 2021-04-15: qty 2

## 2021-04-15 NOTE — ED Triage Notes (Signed)
Pt arrived via EMS after a fall. Pt caught herself on hands and knees. Pt reports pain to both knees, but states that the pain is worse to her left knee. Pt has pain to her right hip, with no shortening or rotation noted. Pt has an obvious deformity to her second toe on the right foot. Pt has hx of hypertension and has not taken her medication today. Pt has red dye on the bottom of both of her feet.

## 2021-04-15 NOTE — ED Notes (Signed)
Patient to radiology.

## 2021-04-15 NOTE — Discharge Instructions (Addendum)
The boot on when walking.  You will need to follow-up with orthopedic surgery within the week.  Call the phone number provided to schedule an appointment.  Return if you have worsening pain or any additional concerns.  Continue take Tylenol and Motrin as needed for pain management.

## 2021-04-15 NOTE — ED Provider Notes (Addendum)
[facility] DEPT Provider Note   CSN: [id number] Arrival date & time: 04/15/21  1804     History Chief Complaint  Patient presents with   Fall   Knee Pain    Stacey Mason is a 60 y.o. female.  Complaining of fall and bilateral knee pain as well as right foot pain.  She states that she tripped and fell and fell forward.  Denies loss of consciousness or head injury.  Denies recent illnesses such as fevers cough vomiting or diarrhea.      Past Medical History:  Diagnosis Date   Abnormal uterine bleeding (AUB)    Cardiac murmur    Hyperlipidemia    Hypertension    Thyroid disease     Patient Active Problem List   Diagnosis Date Noted   Dermatitis 11/06/2018   Post-menopausal bleeding 11/06/2018   HTN (hypertension) 02/26/2013   Hypothyroidism 02/26/2013   Hyperglycemia 02/26/2013   Hyperlipidemia 02/26/2013    Past Surgical History:  Procedure Laterality Date   BREAST SURGERY       OB History   No obstetric history on file.     Family History  Problem Relation Age of Onset   Diabetes Father    Hyperlipidemia Father    Hypertension Father     Social History   Tobacco Use   Smoking status: Never   Smokeless tobacco: Never  Vaping Use   Vaping Use: Never used  Substance Use Topics   Alcohol use: No   Drug use: No    Home Medications Prior to Admission medications   Medication Sig Start Date End Date Taking? Authorizing Provider  amLODipine (NORVASC) 10 MG tablet Take 1 tablet (10 mg total) by mouth daily. 01/01/21   Gildardo Pounds, NP  amLODipine (NORVASC) 10 MG tablet TAKE 1 TABLET (10 MG TOTAL) BY MOUTH DAILY. 01/01/21 01/01/22  Gildardo Pounds, NP  gabapentin (NEURONTIN) 300 MG capsule Take 300 mg by mouth at bedtime. 03/21/21   [provider]  levothyroxine (SYNTHROID) 175 MCG tablet Take 1 tablet (175 mcg total) by mouth daily before breakfast. 01/01/21   Gildardo Pounds, NP  levothyroxine (SYNTHROID) 175  MCG tablet TAKE 1 TABLET (175 MCG TOTAL) BY MOUTH DAILY BEFORE BREAKFAST. 01/01/21 01/01/22  Gildardo Pounds, NP  lisinopril (ZESTRIL) 10 MG tablet Take 1 tablet (10 mg total) by mouth daily. 01/01/21 04/01/21  Gildardo Pounds, NP  lisinopril (ZESTRIL) 10 MG tablet TAKE 1 TABLET (10 MG TOTAL) BY MOUTH DAILY. 01/01/21 01/01/22  Gildardo Pounds, NP  meloxicam (MOBIC) 7.5 MG tablet Take 7.5 mg by mouth 2 (two) times daily as needed for pain. 03/21/21   [provider]  naproxen (NAPROSYN) 500 MG tablet Take 1 tablet (500 mg total) by mouth every 12 (twelve) hours as needed for mild pain or moderate pain. 01/01/21   Gildardo Pounds, NP  naproxen (NAPROSYN) 500 MG tablet TAKE 1 TABLET (500 MG TOTAL) BY MOUTH EVERY 12 (TWELVE) HOURS AS NEEDED FOR MILD PAIN OR MODERATE PAIN. 01/01/21 01/01/22  Gildardo Pounds, NP  pravastatin (PRAVACHOL) 20 MG tablet Take 1 tablet (20 mg total) by mouth daily. 01/01/21   Gildardo Pounds, NP  pravastatin (PRAVACHOL) 20 MG tablet TAKE 1 TABLET (20 MG TOTAL) BY MOUTH DAILY. 01/01/21 01/01/22  Gildardo Pounds, NP  triamcinolone cream (KENALOG) 0.1 % Apply 1 application topically 2 (two) times daily. Patient not taking: No sig reported 11/06/18   Ladell Pier, MD  Allergies    Patient has no allergy information on record.  Review of Systems   Review of Systems  Constitutional:  Negative for fever.  HENT:  Negative for ear pain.   Eyes:  Negative for pain.  Respiratory:  Negative for cough.   Cardiovascular:  Negative for chest pain.  Gastrointestinal:  Negative for abdominal pain.  Genitourinary:  Negative for flank pain.  Musculoskeletal:  Negative for back pain.  Skin:  Negative for rash.  Neurological:  Negative for headaches.   Physical Exam Updated Vital Signs BP (!) 184/105   Pulse 100   Temp 98.5 F (36.9 C) (Oral)   Resp 17   Ht 5\' 6"  (1.676 m)   Wt 95.3 kg   SpO2 100%   BMI 33.89 kg/m   Physical Exam Constitutional:      General: She  is not in acute distress.    Appearance: Normal appearance.  HENT:     Head: Normocephalic.     Nose: Nose normal.  Eyes:     Extraocular Movements: Extraocular movements intact.  Cardiovascular:     Rate and Rhythm: Normal rate.  Pulmonary:     Effort: Pulmonary effort is normal.  Musculoskeletal:     Cervical back: Normal range of motion.     Comments: Labs abrasions and mild tenderness palpation bilateral knees.  Otherwise normal range of motion of bilateral knees and hips with minimal pain.  Deformity of the right foot second toe, no laceration noted.  Neurological:     General: No focal deficit present.     Mental Status: She is alert. Mental status is at baseline.    ED Results / Procedures / Treatments   Labs (all labs ordered are listed, but only abnormal results are displayed) Labs Reviewed - No data to display  EKG None  Radiology DG Knee Complete 4 Views Left  Result Date: 04/15/2021 CLINICAL DATA:  Fall EXAM: LEFT KNEE - COMPLETE 4+ VIEW COMPARISON:  None. FINDINGS: No evidence of fracture, dislocation, or joint effusion. No evidence of arthropathy or other focal bone abnormality. Soft tissues are unremarkable. IMPRESSION: Negative. Electronically Signed   By: Ulyses Jarred M.D.   On: 04/15/2021 19:16   DG Knee Complete 4 Views Right  Result Date: 04/15/2021 CLINICAL DATA:  Fall EXAM: RIGHT KNEE - COMPLETE 4+ VIEW COMPARISON:  None. FINDINGS: No evidence of fracture, dislocation, or joint effusion. No evidence of arthropathy or other focal bone abnormality. Soft tissues are unremarkable. IMPRESSION: Negative. Electronically Signed   By: Ulyses Jarred M.D.   On: 04/15/2021 19:14   DG Foot Complete Right  Result Date: 04/15/2021 CLINICAL DATA:  Fall EXAM: RIGHT FOOT COMPLETE - 3+ VIEW COMPARISON:  None. FINDINGS: There is a medially angulated transverse fracture of the distal shaft of the right second toe proximal phalanx. No other fracture. No dislocation.  IMPRESSION: Medially angulated transverse fracture of the distal shaft of the right second toe proximal phalanx. Electronically Signed   By: Ulyses Jarred M.D.   On: 04/15/2021 19:13    Procedures .Ortho Injury Treatment  Date/Time: 04/15/2021 8:12 PM Performed by: Luna Fuse, MD Authorized by: Luna Fuse, MD  Comments: Right foot second toe appears fractured on x-ray.  This was reduced with gentle straightening and buddy tape to adjacent toe.  Placed in walking shoe.  Neurovascular intact after placement.     Medications Ordered in ED Medications  acetaminophen (TYLENOL) tablet 650 mg (650 mg Oral Given 04/15/21 2006)  ondansetron (ZOFRAN-ODT) disintegrating tablet 4 mg (4 mg Oral Given 04/15/21 2006)  oxyCODONE-acetaminophen (PERCOCET/ROXICET) 5-325 MG per tablet 1 tablet (1 tablet Oral Given 04/15/21 2006)    ED Course  I have reviewed the triage vital signs and the nursing notes.  Pertinent labs & imaging results that were available during my care of the patient were reviewed by me and considered in my medical decision making (see chart for details).    MDM Rules/Calculators/A&P                          X-rays of the knee is unremarkable.  Right foot x-ray shows fracture of the right foot second toe.  Patient given Percocet for pain management.  Second toe has been buddy taped to the adjacent toe and given a walking boot.  Advise outpatient follow-up with orthopedic surgery within the week.  Advised immediate return for worsening pain or any additional concerns.  Final Clinical Impression(s) / ED Diagnoses Final diagnoses:  Knee injury  Closed displaced fracture of phalanx of toe of right foot, unspecified toe, initial encounter    Rx / DC Orders ED Discharge Orders     None        Luna Fuse, MD 04/15/21 2012    Luna Fuse, MD 04/15/21 2013

## 2021-04-17 ENCOUNTER — Other Ambulatory Visit: Payer: Self-pay

## 2021-04-17 ENCOUNTER — Other Ambulatory Visit: Payer: Self-pay | Admitting: Nurse Practitioner

## 2021-04-17 MED FILL — Levothyroxine Sodium Tab 175 MCG: ORAL | 30 days supply | Qty: 30 | Fill #0 | Status: AC

## 2021-04-17 NOTE — Telephone Encounter (Signed)
Requested medication (s) are due for refill today:  yes   Requested medication (s) are on the active medication list: yes  Last refill:  3/21/202  Future visit scheduled: no  Notes to clinic:  message has been sent to patient to contact office for blood work    Requested Prescriptions  Pending Prescriptions Disp Refills   lisinopril (ZESTRIL) 10 MG tablet 90 tablet 0    Sig: TAKE 1 TABLET (10 MG TOTAL) BY MOUTH DAILY.      Cardiovascular:  ACE Inhibitors Failed - 04/17/2021 11:10 AM      Failed - Cr in normal range and within 180 days    Creat  Date Value Ref Range Status  10/25/2014 0.66 0.50 - 1.10 mg/dL Final   Creatinine, Ser  Date Value Ref Range Status  06/04/2019 0.81 0.57 - 1.00 mg/dL Final          Failed - K in normal range and within 180 days    Potassium  Date Value Ref Range Status  06/04/2019 4.0 3.5 - 5.2 mmol/L Final          Passed - Patient is not pregnant      Passed - Last BP in normal range    BP Readings from Last 1 Encounters:  04/15/21 (!) 136/58          Passed - Valid encounter within last 6 months    Recent Outpatient Visits           3 months ago Primary hypertension   [place], Vernia Buff, NP   1 year ago Hypothyroidism, unspecified type   [place]   1 year ago Essential hypertension   [place], Vernia Buff, NP   2 years ago No-show for appointment   [place], Deborah B, MD   2 years ago Essential hypertension   [place], [place], [place]

## 2021-04-17 NOTE — Telephone Encounter (Signed)
Patient needs labs. Last renal function checked in 2020.

## 2021-04-18 ENCOUNTER — Other Ambulatory Visit: Payer: Self-pay

## 2021-04-18 ENCOUNTER — Ambulatory Visit: Payer: 59 | Attending: Nurse Practitioner

## 2021-04-18 ENCOUNTER — Other Ambulatory Visit: Payer: Self-pay | Admitting: Nurse Practitioner

## 2021-04-18 NOTE — Telephone Encounter (Signed)
  Notes to clinic:  Patient has appt today    Requested Prescriptions  Pending Prescriptions Disp Refills   lisinopril (ZESTRIL) 10 MG tablet 90 tablet 0    Sig: TAKE 1 TABLET (10 MG TOTAL) BY MOUTH DAILY.      Cardiovascular:  ACE Inhibitors Failed - 04/18/2021  9:17 AM      Failed - Cr in normal range and within 180 days    Creat  Date Value Ref Range Status  10/25/2014 0.66 0.50 - 1.10 mg/dL Final   Creatinine, Ser  Date Value Ref Range Status  06/04/2019 0.81 0.57 - 1.00 mg/dL Final          Failed - K in normal range and within 180 days    Potassium  Date Value Ref Range Status  06/04/2019 4.0 3.5 - 5.2 mmol/L Final          Passed - Patient is not pregnant      Passed - Last BP in normal range    BP Readings from Last 1 Encounters:  04/15/21 (!) 136/58          Passed - Valid encounter within last 6 months    Recent Outpatient Visits           3 months ago Primary hypertension   [place], Vernia Buff, NP   1 year ago Hypothyroidism, unspecified type   [place]   1 year ago Essential hypertension   [place], Vernia Buff, NP   2 years ago No-show for appointment   [place], Deborah B, MD   2 years ago Essential hypertension   [place], [place], [place]

## 2021-04-18 NOTE — Telephone Encounter (Signed)
Copied from [place] [id number]. Topic: Quick Communication - Rx Refill/Question >> Apr 18, 2021  9:08 AM Tessa Lerner A wrote: Medication: lisinopril (ZESTRIL) 10 MG tablet   levothyroxine (SYNTHROID) 175 MCG tablet   Has the patient contacted their pharmacy? Pharmacy called on behalf of patient  (Agent: If no, request that the patient contact the pharmacy for the refill.) (Agent: If yes, when and what did the pharmacy advise?)  Preferred Pharmacy (with phone number or street name): [facility] [address] [place], [place]. AT [address]  Phone:  [phone] Fax:  [phone]   Agent: Please be advised that RX refills may take up to 3 business days. We ask that you follow-up with your pharmacy.

## 2021-04-19 ENCOUNTER — Other Ambulatory Visit: Payer: Self-pay | Admitting: Nurse Practitioner

## 2021-04-19 LAB — CMP14+EGFR
ALT: 12 IU/L (ref 0–32)
AST: 13 IU/L (ref 0–40)
Albumin/Globulin Ratio: 1.6 (ref 1.2–2.2)
Albumin: 4.1 g/dL (ref 3.8–4.9)
Alkaline Phosphatase: 112 IU/L (ref 44–121)
BUN/Creatinine Ratio: 19 (ref 9–23)
BUN: 14 mg/dL (ref 6–24)
Bilirubin Total: <0.2 mg/dL (ref 0.0–1.2)
CO2: 21 mmol/L (ref 20–29)
Calcium: 9.1 mg/dL (ref 8.7–10.2)
Chloride: 104 mmol/L (ref 96–106)
Creatinine, Ser: 0.72 mg/dL (ref 0.57–1.00)
Globulin, Total: 2.6 g/dL (ref 1.5–4.5)
Glucose: 104 mg/dL — ABNORMAL HIGH (ref 65–99)
Potassium: 4.6 mmol/L (ref 3.5–5.2)
Sodium: 140 mmol/L (ref 134–144)
Total Protein: 6.7 g/dL (ref 6.0–8.5)
eGFR: 97 mL/min/{1.73_m2} (ref >59)

## 2021-04-19 LAB — HEMOGLOBIN A1C
Est. average glucose Bld gHb Est-mCnc: 131 mg/dL
Hgb A1c MFr Bld: 6.2 % — ABNORMAL HIGH (ref 4.8–5.6)

## 2021-04-19 LAB — CBC
Hematocrit: 38.9 % (ref 34.0–46.6)
Hemoglobin: 12.5 g/dL (ref 11.1–15.9)
MCH: 27.5 pg (ref 26.6–33.0)
MCHC: 32.1 g/dL (ref 31.5–35.7)
MCV: 86 fL (ref 79–97)
Platelets: 507 10*3/uL — ABNORMAL HIGH (ref 150–450)
RBC: 4.54 x10E6/uL (ref 3.77–5.28)
RDW: 13.3 % (ref 11.7–15.4)
WBC: 8.1 10*3/uL (ref 3.4–10.8)

## 2021-04-19 LAB — HCV AB W REFLEX TO QUANT PCR: HCV Ab: 0.1 s/co ratio (ref 0.0–0.9)

## 2021-04-19 LAB — LIPID PANEL
Chol/HDL Ratio: 5.3 ratio — ABNORMAL HIGH (ref 0.0–4.4)
Cholesterol, Total: 219 mg/dL — ABNORMAL HIGH (ref 100–199)
HDL: 41 mg/dL (ref >39)
LDL Chol Calc (NIH): 153 mg/dL — ABNORMAL HIGH (ref 0–99)
Triglycerides: 135 mg/dL (ref 0–149)
VLDL Cholesterol Cal: 25 mg/dL (ref 5–40)

## 2021-04-19 LAB — HIV ANTIBODY (ROUTINE TESTING W REFLEX): HIV Screen 4th Generation wRfx: NONREACTIVE

## 2021-04-19 LAB — TSH: TSH: 14.8 u[IU]/mL — ABNORMAL HIGH (ref 0.450–4.500)

## 2021-04-19 LAB — HCV INTERPRETATION

## 2021-04-19 MED ORDER — LISINOPRIL 10 MG PO TABS: ORAL_TABLET | Freq: Every day | ORAL | 0 refills | Status: DC

## 2021-04-19 MED ORDER — LEVOTHYROXINE SODIUM 175 MCG PO TABS: ORAL_TABLET | ORAL | 0 refills | Status: DC

## 2021-04-30 ENCOUNTER — Other Ambulatory Visit: Payer: Self-pay

## 2021-05-14 IMAGING — CR DG FEMUR 2+V*L*
4 series · 4 of 4 positions shown · non-contrast
Comparison: None.

CLINICAL DATA: Status post fall.

EXAM:
LEFT FEMUR 2 VIEWS

[t femur proximal ap left]
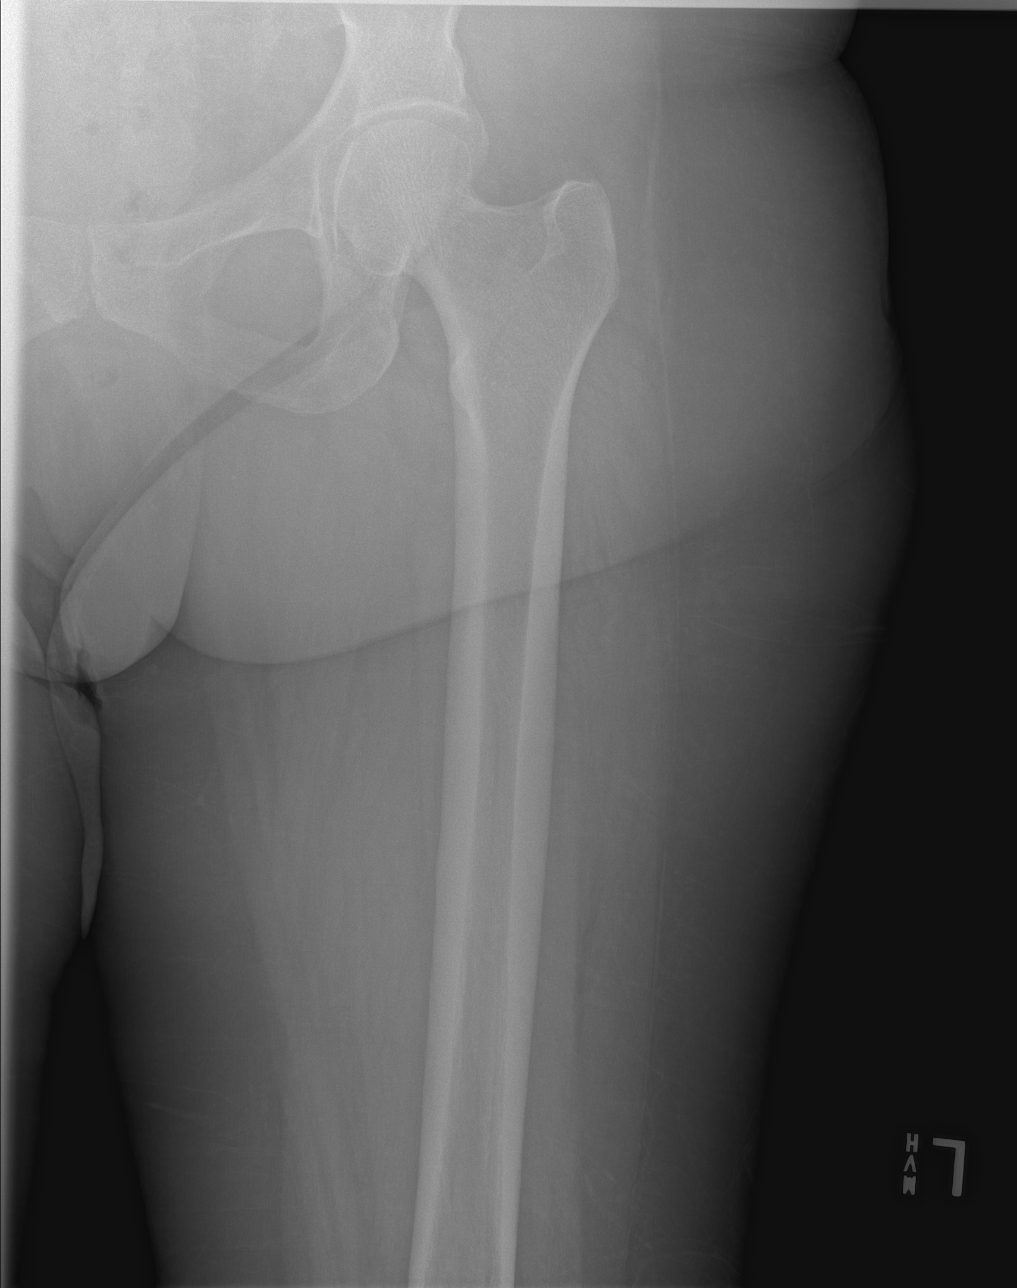

[t femur distal ap left]
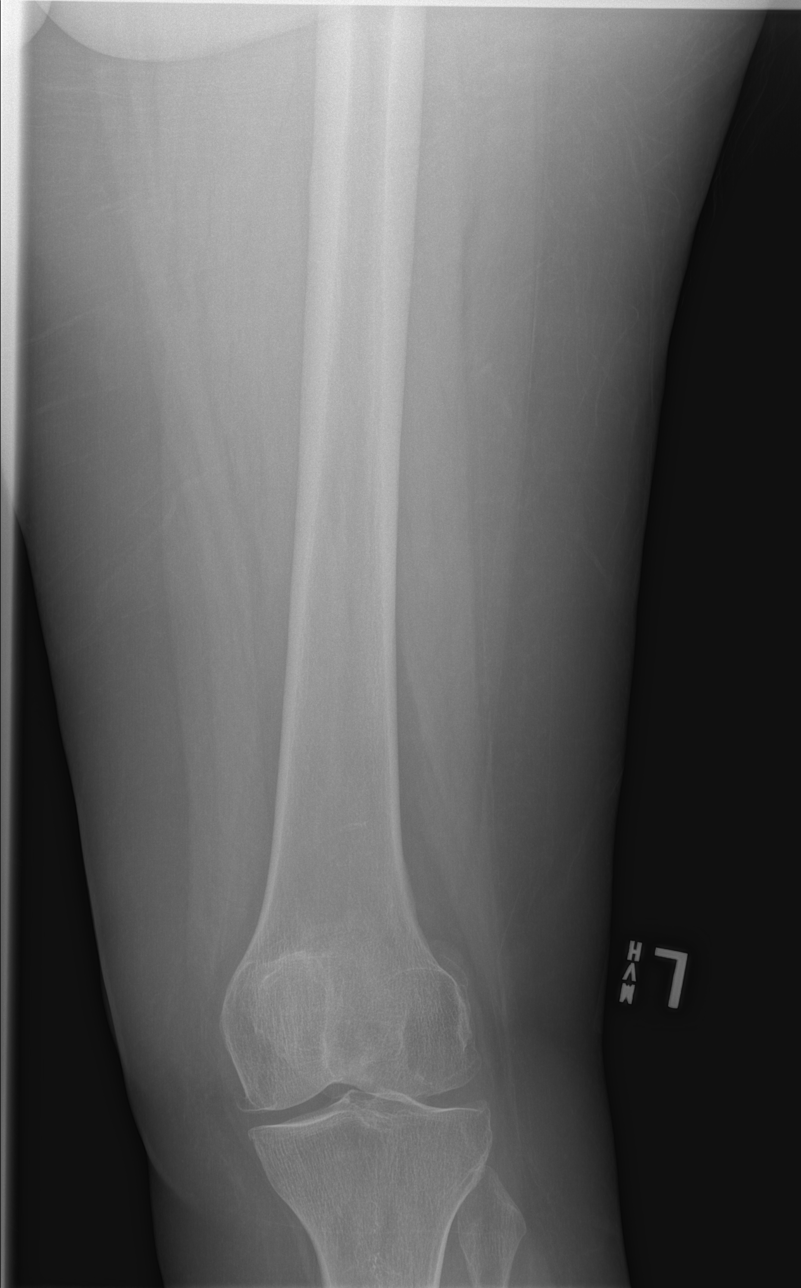

[t femur proximal lat left]
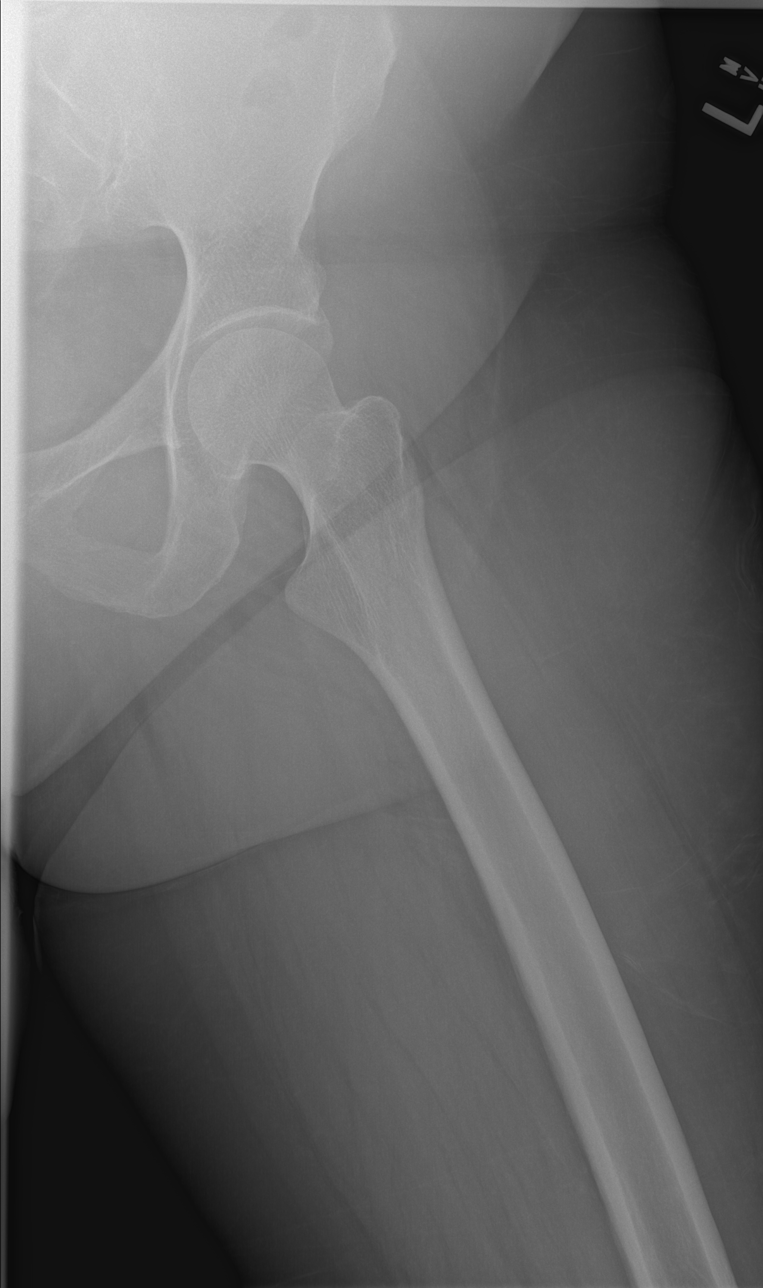

[t femur distal lat left]
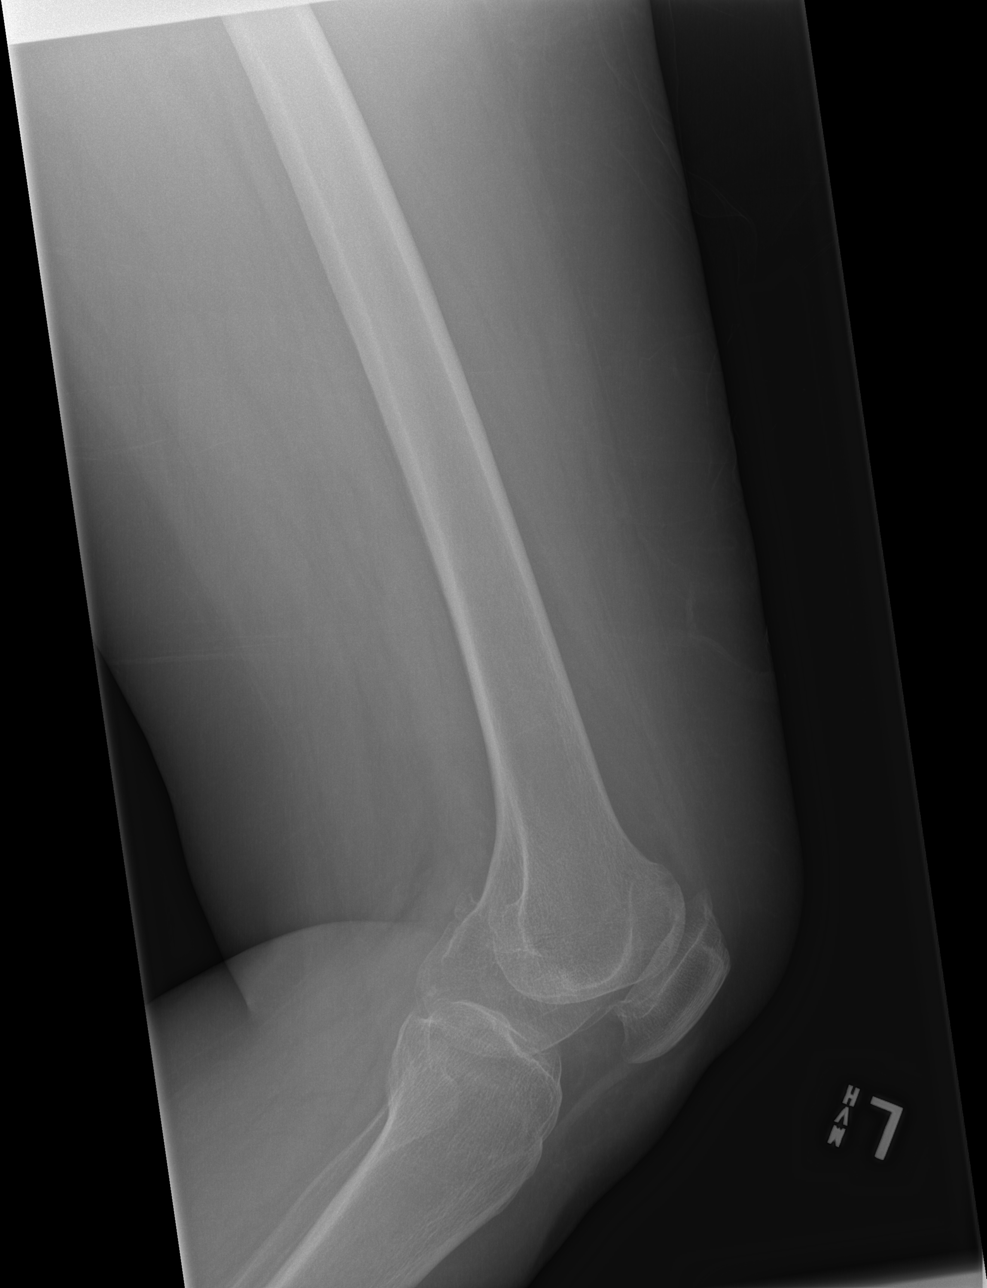

[4 of 4 positions shown; findings below may reference images not displayed]

FINDINGS: There is no evidence of fracture or other focal bone lesions. Soft
tissues are unremarkable.
IMPRESSION: Negative.

## 2021-08-02 ENCOUNTER — Other Ambulatory Visit: Payer: Self-pay | Admitting: Nurse Practitioner

## 2021-08-02 ENCOUNTER — Other Ambulatory Visit: Payer: Self-pay

## 2021-08-02 ENCOUNTER — Other Ambulatory Visit: Payer: Self-pay | Admitting: Family Medicine

## 2021-08-03 ENCOUNTER — Other Ambulatory Visit: Payer: Self-pay

## 2021-08-03 NOTE — Telephone Encounter (Signed)
Requested medications are due for refill today NO, this is an old rx  Requested medications are on the active medication list newer rx on current med list  Last visit 01/02/20  Future visit scheduled no  Notes to clinic duplicate, please assess.  Requested Prescriptions  Pending Prescriptions Disp Refills   levothyroxine (SYNTHROID) 175 MCG tablet 30 tablet 0    Sig: TAKE 1 TABLET (175 MCG TOTAL) BY MOUTH DAILY BEFORE BREAKFAST.     Endocrinology:  Hypothyroid Agents Failed - 08/02/2021  2:53 PM      Failed - TSH needs to be rechecked within 3 months after an abnormal result. Refill until TSH is due.      Failed - TSH in normal range and within 360 days    TSH  Date Value Ref Range Status  04/18/2021 14.800 (H) 0.450 - 4.500 uIU/mL Final          Passed - Valid encounter within last 12 months    Recent Outpatient Visits           7 months ago Primary hypertension   [place], Vernia Buff, NP   1 year ago Hypothyroidism, unspecified type   [place]   2 years ago Essential hypertension   [place], Vernia Buff, NP   2 years ago No-show for appointment   [place], Deborah B, MD   2 years ago Essential hypertension   [place], [place]

## 2021-08-03 NOTE — Telephone Encounter (Signed)
Requested medications are due for refill today NO, new rx current  Requested medications are on the active medication list newer rx available  Last visit 01/01/20  Future visit scheduled NO  Notes to clinic out of date  rx, please assess.  Requested Prescriptions  Pending Prescriptions Disp Refills   lisinopril (ZESTRIL) 10 MG tablet 90 tablet 0    Sig: TAKE 1 TABLET (10 MG TOTAL) BY MOUTH DAILY.     Cardiovascular:  ACE Inhibitors Failed - 08/02/2021  2:53 PM      Failed - Valid encounter within last 6 months    Recent Outpatient Visits           7 months ago Primary hypertension   [place], [place] W, NP   1 year ago Hypothyroidism, unspecified type   [place]   2 years ago Essential hypertension   [place], [place] W, NP   2 years ago No-show for appointment   [place], Deborah B, MD   2 years ago Essential hypertension   [place], Levada Dy M, [place]              Passed - Cr in normal range and within 180 days    Creat  Date Value Ref Range Status  10/25/2014 0.66 0.50 - 1.10 mg/dL Final   Creatinine, Ser  Date Value Ref Range Status  04/18/2021 0.72 0.57 - 1.00 mg/dL Final          Passed - K in normal range and within 180 days    Potassium  Date Value Ref Range Status  04/18/2021 4.6 3.5 - 5.2 mmol/L Final          Passed - Patient is not pregnant      Passed - Last BP in normal range    BP Readings from Last 1 Encounters:  04/15/21 (!) 136/58

## 2021-09-10 ENCOUNTER — Ambulatory Visit: Payer: 59 | Attending: Physician Assistant | Admitting: Physician Assistant

## 2021-09-10 ENCOUNTER — Encounter: Payer: Self-pay | Admitting: Physician Assistant

## 2021-09-10 ENCOUNTER — Other Ambulatory Visit: Payer: Self-pay

## 2021-09-10 VITALS — BP 160/95 | HR 100 | Temp 98.9°F | Resp 18 | Ht 65.0 in | Wt 211.0 lb

## 2021-09-10 DIAGNOSIS — R011 Cardiac murmur, unspecified: Secondary | ICD-10-CM

## 2021-09-10 DIAGNOSIS — E7800 Pure hypercholesterolemia, unspecified: Secondary | ICD-10-CM

## 2021-09-10 DIAGNOSIS — I10 Essential (primary) hypertension: Secondary | ICD-10-CM

## 2021-09-10 DIAGNOSIS — R7303 Prediabetes: Secondary | ICD-10-CM | POA: Diagnosis not present

## 2021-09-10 DIAGNOSIS — Z6835 Body mass index (BMI) 35.0-35.9, adult: Secondary | ICD-10-CM

## 2021-09-10 DIAGNOSIS — D75839 Thrombocytosis, unspecified: Secondary | ICD-10-CM

## 2021-09-10 DIAGNOSIS — G8929 Other chronic pain: Secondary | ICD-10-CM

## 2021-09-10 DIAGNOSIS — M25561 Pain in right knee: Secondary | ICD-10-CM | POA: Diagnosis not present

## 2021-09-10 DIAGNOSIS — E039 Hypothyroidism, unspecified: Secondary | ICD-10-CM

## 2021-09-10 DIAGNOSIS — M25562 Pain in left knee: Secondary | ICD-10-CM

## 2021-09-10 LAB — POCT GLYCOSYLATED HEMOGLOBIN (HGB A1C): Hemoglobin A1C: 6.4 % — AB (ref 4.0–5.6)

## 2021-09-10 MED ORDER — PRAVASTATIN SODIUM 20 MG PO TABS: 20.0000 mg | ORAL_TABLET | Freq: Every day | ORAL | 3 refills | Status: DC

## 2021-09-10 MED ORDER — METFORMIN HCL ER 500 MG PO TB24: 500.0000 mg | ORAL_TABLET | Freq: Every day | ORAL | 1 refills | Status: DC

## 2021-09-10 MED ORDER — LISINOPRIL 10 MG PO TABS: ORAL_TABLET | Freq: Every day | ORAL | 1 refills | Status: DC

## 2021-09-10 MED ORDER — AMLODIPINE BESYLATE 10 MG PO TABS: 10.0000 mg | ORAL_TABLET | Freq: Every day | ORAL | 1 refills | Status: DC

## 2021-09-10 NOTE — Progress Notes (Signed)
Established Patient Office Visit  Subjective:  Patient ID: Stacey Mason, female    DOB: 02-11-1961  Age: 60 y.o. MRN: [id number]  CC:  Chief Complaint  Patient presents with   Hypothyroidism    HPI Stacey Mason presents for medication refills.  [place] that [place].  States that she has been consistent with her medications with the exception of amlodipine which she has been out of the for past month.   Reports that she continues to have bilateral knee pain resulting from a fall she had back in July.  States that she did have x-rays completed after the fall that did not show any acute changes.  Reports that she especially will feel the pain when she is kneeling to pray.  Reports that she will use Vicks rub on them with some relief.  Denies swelling, numbness or tingling.      Past Medical History:  Diagnosis Date   Abnormal uterine bleeding (AUB)    Cardiac murmur    Hyperlipidemia    Hypertension    Thyroid disease     Past Surgical History:  Procedure Laterality Date   BREAST SURGERY      Family History  Problem Relation Age of Onset   Diabetes Father    Hyperlipidemia Father    Hypertension Father     Social History   Socioeconomic History   Marital status: Married    Spouse name: Not on file   Number of children: Not on file   Years of education: Not on file   Highest education level: Not on file  Occupational History   Not on file  Tobacco Use   Smoking status: Never   Smokeless tobacco: Never  Vaping Use   Vaping Use: Never used  Substance and Sexual Activity   Alcohol use: No   Drug use: No   Sexual activity: Not Currently  Other Topics Concern   Not on file  Social History Narrative   From [place].  In the [place] since 2003.   Social Determinants of Health   Financial Resource Strain: Not on file  Food Insecurity: Not on file  Transportation Needs: Not on file  Physical Activity: Not on file  Stress: Not on file  Social  Connections: Not on file  Intimate Partner Violence: Not on file    Outpatient Medications Prior to Visit  Medication Sig Dispense Refill   gabapentin (NEURONTIN) 300 MG capsule Take 300 mg by mouth at bedtime.     levothyroxine (SYNTHROID) 175 MCG tablet TAKE 1 TABLET (175 MCG TOTAL) BY MOUTH DAILY BEFORE BREAKFAST. 30 tablet 0   meloxicam (MOBIC) 7.5 MG tablet Take 7.5 mg by mouth 2 (two) times daily as needed for pain.     naproxen (NAPROSYN) 500 MG tablet Take 1 tablet (500 mg total) by mouth every 12 (twelve) hours as needed for mild pain or moderate pain. 60 tablet 1   naproxen (NAPROSYN) 500 MG tablet TAKE 1 TABLET (500 MG TOTAL) BY MOUTH EVERY 12 (TWELVE) HOURS AS NEEDED FOR MILD PAIN OR MODERATE PAIN. 60 tablet 1   triamcinolone cream (KENALOG) 0.1 % Apply 1 application topically 2 (two) times daily. (Patient not taking: No sig reported) 30 g 1   amLODipine (NORVASC) 10 MG tablet Take 1 tablet (10 mg total) by mouth daily. 90 tablet 3   amLODipine (NORVASC) 10 MG tablet TAKE 1 TABLET (10 MG TOTAL) BY MOUTH DAILY. 90 tablet 3   lisinopril (ZESTRIL)  10 MG tablet TAKE 1 TABLET (10 MG TOTAL) BY MOUTH DAILY. 30 tablet 0   pravastatin (PRAVACHOL) 20 MG tablet Take 1 tablet (20 mg total) by mouth daily. 90 tablet 3   pravastatin (PRAVACHOL) 20 MG tablet TAKE 1 TABLET (20 MG TOTAL) BY MOUTH DAILY. 90 tablet 3   No facility-administered medications prior to visit.    No Known Allergies  ROS Review of Systems  Constitutional: Negative.   HENT: Negative.    Eyes: Negative.   Respiratory:  Negative for shortness of breath.   Cardiovascular:  Negative for chest pain.  Gastrointestinal: Negative.   Endocrine: Negative.   Genitourinary: Negative.   Musculoskeletal:  Positive for arthralgias. Negative for joint swelling.  Skin: Negative.   Allergic/Immunologic: Negative.   Neurological: Negative.   Hematological: Negative.   Psychiatric/Behavioral: Negative.       Objective:     Physical Exam Vitals and nursing note reviewed.  Constitutional:      Appearance: Normal appearance. She is obese.  HENT:     Head: Normocephalic and atraumatic.     Right Ear: External ear normal.     Left Ear: External ear normal.     Nose: Nose normal.     Mouth/Throat:     Mouth: Mucous membranes are moist.     Pharynx: Oropharynx is clear.  Eyes:     Extraocular Movements: Extraocular movements intact.     Conjunctiva/sclera: Conjunctivae normal.     Pupils: Pupils are equal, round, and reactive to light.  Cardiovascular:     Rate and Rhythm: Normal rate and regular rhythm.     Pulses: Normal pulses.     Heart sounds: Murmur heard.  Pulmonary:     Effort: Pulmonary effort is normal.     Breath sounds: Normal breath sounds.  Neurological:     Mental Status: She is alert.    BP (!) 160/95 (BP Location: Left Arm, Patient Position: Sitting, Cuff Size: Large)   Pulse 100   Temp 98.9 F (37.2 C) (Oral)   Resp 18   Ht 5' 5"  (1.651 m)   Wt 211 lb (95.7 kg)   SpO2 95%   BMI 35.11 kg/m  Wt Readings from Last 3 Encounters:  09/10/21 211 lb (95.7 kg)  04/15/21 210 lb (95.3 kg)  01/01/21 211 lb (95.7 kg)     Health Maintenance Due  Topic Date Due   COVID-19 Vaccine (1) Never done   PAP SMEAR-Modifier  Never done   COLONOSCOPY (Pts 45-61yr Insurance coverage will need to be confirmed)  Never done   MAMMOGRAM  Never done   Zoster Vaccines- Shingrix (1 of 2) Never done   INFLUENZA VACCINE  Never done    There are no preventive care reminders to display for this patient.  Lab Results  Component Value Date   TSH 8.440 (H) 09/10/2021   Lab Results  Component Value Date   WBC 8.2 09/10/2021   HGB 13.3 09/10/2021   HCT 41.1 09/10/2021   MCV 85 09/10/2021   PLT 491 (H) 09/10/2021   Lab Results  Component Value Date   NA 140 04/18/2021   K 4.6 04/18/2021   CO2 21 04/18/2021   GLUCOSE 104 (H) 04/18/2021   BUN 14 04/18/2021   CREATININE 0.72 04/18/2021    BILITOT <0.2 04/18/2021   ALKPHOS 112 04/18/2021   AST 13 04/18/2021   ALT 12 04/18/2021   PROT 6.7 04/18/2021   ALBUMIN 4.1 04/18/2021   CALCIUM 9.1 04/18/2021  ANIONGAP 10 10/17/2016   EGFR 97 04/18/2021   Lab Results  Component Value Date   CHOL 219 (H) 04/18/2021   Lab Results  Component Value Date   HDL 41 04/18/2021   Lab Results  Component Value Date   LDLCALC 153 (H) 04/18/2021   Lab Results  Component Value Date   TRIG 135 04/18/2021   Lab Results  Component Value Date   CHOLHDL 5.3 (H) 04/18/2021   Lab Results  Component Value Date   HGBA1C 6.4 (A) 09/10/2021      Assessment & Plan:   Problem List Items Addressed This Visit       Cardiovascular and Mediastinum   HTN (hypertension) - Primary   Relevant Medications   amLODipine (NORVASC) 10 MG tablet   lisinopril (ZESTRIL) 10 MG tablet   pravastatin (PRAVACHOL) 20 MG tablet   Other Relevant Orders   CBC with Differential (Completed)     Endocrine   Hypothyroidism   Relevant Orders   Thyroid Panel With TSH (Completed)     Hematopoietic and Hemostatic   Thrombocytosis     Other   Hyperlipidemia   Relevant Medications   amLODipine (NORVASC) 10 MG tablet   lisinopril (ZESTRIL) 10 MG tablet   pravastatin (PRAVACHOL) 20 MG tablet   Chronic pain of both knees   Relevant Orders   Ambulatory referral to Orthopedic Surgery   Murmur, cardiac   Relevant Orders   Ambulatory referral to Cardiology   Prediabetes   Relevant Medications   metFORMIN (GLUCOPHAGE XR) 500 MG 24 hr tablet   Other Relevant Orders   POCT A1C (Completed)   Class 2 severe obesity due to excess calories with serious comorbidity and body mass index (BMI) of 35.0 to 35.9 in adult [facility])   Relevant Medications   metFORMIN (GLUCOPHAGE XR) 500 MG 24 hr tablet    Meds ordered this encounter  Medications   amLODipine (NORVASC) 10 MG tablet    Sig: Take 1 tablet (10 mg total) by mouth daily.    Dispense:  90 tablet    Refill:   1    Order Specific Question:   Supervising Provider    Answer:   Joya Gaskins, PATRICK E [[id number]]   lisinopril (ZESTRIL) 10 MG tablet    Sig: TAKE 1 TABLET (10 MG TOTAL) BY MOUTH DAILY.    Dispense:  90 tablet    Refill:  1    Order Specific Question:   Supervising Provider    Answer:   Asencion Noble E [[id number]]   pravastatin (PRAVACHOL) 20 MG tablet    Sig: Take 1 tablet (20 mg total) by mouth daily.    Dispense:  90 tablet    Refill:  3    Order Specific Question:   Supervising Provider    Answer:   Elsie Stain [[id number]]   metFORMIN (GLUCOPHAGE XR) 500 MG 24 hr tablet    Sig: Take 1 tablet (500 mg total) by mouth daily with breakfast.    Dispense:  90 tablet    Refill:  1    Order Specific Question:   Supervising Provider    Answer:   Joya Gaskins, PATRICK E [[id number]]  1. Primary hypertension Continue current regimen.  Patient encouraged to check blood pressure at home, keep a written log and have available for all office visits. - CBC with Differential - amLODipine (NORVASC) 10 MG tablet; Take 1 tablet (10 mg total) by mouth daily.  Dispense: 90 tablet; Refill: 1 - lisinopril (ZESTRIL) 10  MG tablet; TAKE 1 TABLET (10 MG TOTAL) BY MOUTH DAILY.  Dispense: 90 tablet; Refill: 1  2. Pure hypercholesterolemia Continue current regimen - pravastatin (PRAVACHOL) 20 MG tablet; Take 1 tablet (20 mg total) by mouth daily.  Dispense: 90 tablet; Refill: 3  3. Hypothyroidism, unspecified type Will refill medication based on today's lab results. - Thyroid Panel With TSH  4. Chronic pain of both knees Patient education given on supportive care, refer to orthopedics for further evaluation - Ambulatory referral to Orthopedic Surgery  5. Murmur, cardiac Not previously evaluated - Ambulatory referral to Cardiology  6. Prediabetes A1c 6.4.  Patient agreeable to trial metformin, lifestyle modifications.  Patient education given on diabetic diet - POCT A1C - metFORMIN (GLUCOPHAGE XR) 500 MG 24 hr tablet;  Take 1 tablet (500 mg total) by mouth daily with breakfast.  Dispense: 90 tablet; Refill: 1  7. Thrombocytosis  8. Class 2 severe obesity due to excess calories with serious comorbidity and body mass index (BMI) of 35.0 to 35.9 in adult [facility])    I have reviewed the patient's medical history (PMH, PSH, Social History, Family History, Medications, and allergies) , and have been updated if relevant. I spent 30 minutes reviewing chart and  face to face time with patient.     Follow-up: Return in about 6 weeks (around 10/22/2021) for with zelda for thyroid follow up .    Loraine Grip Mayers, PA-C

## 2021-09-10 NOTE — Patient Instructions (Signed)
You are going to start taking metformin 500 mg once daily with breakfast to help you with your elevated blood sugar levels.  I do encourage you to work on a low sugar lower carbohydrate diet as well.  We will call you with today's lab results and I will refill your thyroid medication based on those results.  Kennieth Rad, PA-C Physician Assistant [facility] [email or web address]  Prediabetes Eating Plan Prediabetes is a condition that causes blood sugar (glucose) levels to be higher than normal. This increases the risk for developing type 2 diabetes (type 2 diabetes mellitus). Working with a health care provider or nutrition specialist (dietitian) to make diet and lifestyle changes can help prevent the onset of diabetes. These changes may help you: Control your blood glucose levels. Improve your cholesterol levels. Manage your blood pressure. What are tips for following this plan? Reading food labels Read food labels to check the amount of fat, salt (sodium), and sugar in prepackaged foods. Avoid foods that have: Saturated fats. Trans fats. Added sugars. Avoid foods that have more than 300 milligrams (mg) of sodium per serving. Limit your sodium intake to less than 2,300 mg each day. Shopping Avoid buying pre-made and processed foods. Avoid buying drinks with added sugar. Cooking Cook with olive oil. Do not use butter, lard, or ghee. Bake, broil, grill, steam, or boil foods. Avoid frying. Meal planning  Work with your dietitian to create an eating plan that is right for you. This may include tracking how many calories you take in each day. Use a food diary, notebook, or mobile application to track what you eat at each meal. Consider following a Mediterranean diet. This includes: Eating several servings of fresh fruits and vegetables each day. Eating fish at least twice a week. Eating one serving each day of whole grains, beans,  nuts, and seeds. Using olive oil instead of other fats. Limiting alcohol. Limiting red meat. Using nonfat or low-fat dairy products. Consider following a plant-based diet. This includes dietary choices that focus on eating mostly vegetables and fruit, grains, beans, nuts, and seeds. If you have high blood pressure, you may need to limit your sodium intake or follow a diet such as the DASH (Dietary Approaches to Stop Hypertension) eating plan. The DASH diet aims to lower high blood pressure. Lifestyle Set weight loss goals with help from your health care team. It is recommended that most people with prediabetes lose 7% of their body weight. Exercise for at least 30 minutes 5 or more days a week. Attend a support group or seek support from a mental health counselor. Take over-the-counter and prescription medicines only as told by your health care provider. What foods are recommended? Fruits Berries. Bananas. Apples. Oranges. Grapes. Papaya. Mango. Pomegranate. Kiwi. Grapefruit. Cherries. Vegetables Lettuce. Spinach. Peas. Beets. Cauliflower. Cabbage. Broccoli. Carrots. Tomatoes. Squash. Eggplant. Herbs. Peppers. Onions. Cucumbers. Brussels sprouts. Grains Whole grains, such as whole-wheat or whole-grain breads, crackers, cereals, and pasta. Unsweetened oatmeal. Bulgur. Barley. Quinoa. Brown rice. Corn or whole-wheat flour tortillas or taco shells. Meats and other proteins Seafood. Poultry without skin. Lean cuts of pork and beef. Tofu. Eggs. Nuts. Beans. Dairy Low-fat or fat-free dairy products, such as yogurt, cottage cheese, and cheese. Beverages Water. Tea. Coffee. Sugar-free or diet soda. Seltzer water. Low-fat or nonfat milk. Milk alternatives, such as soy or almond milk. Fats and oils Olive oil. Canola oil. Sunflower oil. Grapeseed oil. Avocado. Walnuts. Sweets and desserts Sugar-free or low-fat pudding. Sugar-free or low-fat ice cream  and other frozen treats. Seasonings and  condiments Herbs. Sodium-free spices. Mustard. Relish. Low-salt, low-sugar ketchup. Low-salt, low-sugar barbecue sauce. Low-fat or fat-free mayonnaise. The items listed above may not be a complete list of recommended foods and beverages. Contact a dietitian for more information. What foods are not recommended? Fruits Fruits canned with syrup. Vegetables Canned vegetables. Frozen vegetables with butter or cream sauce. Grains Refined white flour and flour products, such as bread, pasta, snack foods, and cereals. Meats and other proteins Fatty cuts of meat. Poultry with skin. Breaded or fried meat. Processed meats. Dairy Full-fat yogurt, cheese, or milk. Beverages Sweetened drinks, such as iced tea and soda. Fats and oils Butter. Lard. Ghee. Sweets and desserts Baked goods, such as cake, cupcakes, pastries, cookies, and cheesecake. Seasonings and condiments Spice mixes with added salt. Ketchup. Barbecue sauce. Mayonnaise. The items listed above may not be a complete list of foods and beverages that are not recommended. Contact a dietitian for more information. Where to find more information American Diabetes Association: www.diabetes.org Summary You may need to make diet and lifestyle changes to help prevent the onset of diabetes. These changes can help you control blood sugar, improve cholesterol levels, and manage blood pressure. Set weight loss goals with help from your health care team. It is recommended that most people with prediabetes lose 7% of their body weight. Consider following a Mediterranean diet. This includes eating plenty of fresh fruits and vegetables, whole grains, beans, nuts, seeds, fish, and low-fat dairy, and using olive oil instead of other fats. This information is not intended to replace advice given to you by your health care provider. Make sure you discuss any questions you have with your health care provider. Document Revised: 12/30/2019 Document Reviewed:  12/30/2019 Elsevier Patient Education  [place].

## 2021-09-11 ENCOUNTER — Other Ambulatory Visit: Payer: Self-pay | Admitting: Physician Assistant

## 2021-09-11 DIAGNOSIS — Z6835 Body mass index (BMI) 35.0-35.9, adult: Secondary | ICD-10-CM | POA: Insufficient documentation

## 2021-09-11 DIAGNOSIS — D75839 Thrombocytosis, unspecified: Secondary | ICD-10-CM | POA: Insufficient documentation

## 2021-09-11 DIAGNOSIS — M25562 Pain in left knee: Secondary | ICD-10-CM | POA: Insufficient documentation

## 2021-09-11 DIAGNOSIS — E039 Hypothyroidism, unspecified: Secondary | ICD-10-CM

## 2021-09-11 DIAGNOSIS — R011 Cardiac murmur, unspecified: Secondary | ICD-10-CM | POA: Insufficient documentation

## 2021-09-11 DIAGNOSIS — G8929 Other chronic pain: Secondary | ICD-10-CM | POA: Insufficient documentation

## 2021-09-11 DIAGNOSIS — E66812 Obesity, class 2: Secondary | ICD-10-CM | POA: Insufficient documentation

## 2021-09-11 DIAGNOSIS — R7303 Prediabetes: Secondary | ICD-10-CM | POA: Insufficient documentation

## 2021-09-11 LAB — CBC WITH DIFFERENTIAL/PLATELET
Basophils Absolute: 0.1 10*3/uL (ref 0.0–0.2)
Basos: 1 %
EOS (ABSOLUTE): 0.3 10*3/uL (ref 0.0–0.4)
Eos: 4 %
Hematocrit: 41.1 % (ref 34.0–46.6)
Hemoglobin: 13.3 g/dL (ref 11.1–15.9)
Immature Grans (Abs): 0 10*3/uL (ref 0.0–0.1)
Immature Granulocytes: 0 %
Lymphocytes Absolute: 3.6 10*3/uL — ABNORMAL HIGH (ref 0.7–3.1)
Lymphs: 44 %
MCH: 27.4 pg (ref 26.6–33.0)
MCHC: 32.4 g/dL (ref 31.5–35.7)
MCV: 85 fL (ref 79–97)
Monocytes Absolute: 0.9 10*3/uL (ref 0.1–0.9)
Monocytes: 11 %
Neutrophils Absolute: 3.3 10*3/uL (ref 1.4–7.0)
Neutrophils: 40 %
Platelets: 491 10*3/uL — ABNORMAL HIGH (ref 150–450)
RBC: 4.86 x10E6/uL (ref 3.77–5.28)
RDW: 13.1 % (ref 11.7–15.4)
WBC: 8.2 10*3/uL (ref 3.4–10.8)

## 2021-09-11 LAB — THYROID PANEL WITH TSH
Free Thyroxine Index: 1.7 (ref 1.2–4.9)
T3 Uptake Ratio: 24 % (ref 24–39)
T4, Total: 7.2 ug/dL (ref 4.5–12.0)
TSH: 8.44 u[IU]/mL — ABNORMAL HIGH (ref 0.450–4.500)

## 2021-09-11 MED ORDER — LEVOTHYROXINE SODIUM 200 MCG PO TABS: 200.0000 ug | ORAL_TABLET | Freq: Every day | ORAL | 1 refills | Status: DC

## 2021-09-12 ENCOUNTER — Telehealth: Payer: Self-pay | Admitting: Nurse Practitioner

## 2021-09-12 NOTE — Telephone Encounter (Signed)
Patients daughter called in with question about metformin, states since is only prediabetic why she has to take the medicine, and also has 2 bp medicines and wants to know why she has to take that with the lisinopril. Please call back

## 2021-09-12 NOTE — Telephone Encounter (Signed)
Patient's daughter May, on [place], called, no answer, no voicemail.

## 2021-09-13 NOTE — Telephone Encounter (Signed)
Called pt unable to reach or leave message call did not go trough

## 2021-09-19 ENCOUNTER — Telehealth: Payer: Self-pay | Admitting: *Deleted

## 2021-09-19 ENCOUNTER — Ambulatory Visit: Payer: 59 | Admitting: Orthopaedic Surgery

## 2021-09-19 ENCOUNTER — Ambulatory Visit: Payer: 59 | Admitting: Cardiovascular Disease

## 2021-09-19 NOTE — Telephone Encounter (Signed)
-----   Message from Kennieth Rad, [place] sent at 09/11/2021  1:03 PM EST ----- Please call patient and let her know that her thyroid function is still abnormal, her medication will increase to 200 mcg once daily.  New prescription sent to her pharmacy.  Is important that she keep her follow-up with her primary care in 6 weeks for further evaluation.

## 2021-09-19 NOTE — Telephone Encounter (Signed)
Patient verified DOB Patients daughter is aware of TSH still being abnormal and needing to take mcg which has been sent to the pharmacy Patient reminded to keep 6 week FU for recheck.

## 2021-09-25 ENCOUNTER — Encounter: Payer: Self-pay | Admitting: Orthopaedic Surgery

## 2021-09-25 ENCOUNTER — Ambulatory Visit (INDEPENDENT_AMBULATORY_CARE_PROVIDER_SITE_OTHER): Payer: 59 | Admitting: Orthopaedic Surgery

## 2021-09-25 ENCOUNTER — Ambulatory Visit (INDEPENDENT_AMBULATORY_CARE_PROVIDER_SITE_OTHER): Payer: 59

## 2021-09-25 ENCOUNTER — Other Ambulatory Visit: Payer: Self-pay

## 2021-09-25 DIAGNOSIS — M25562 Pain in left knee: Secondary | ICD-10-CM

## 2021-09-25 DIAGNOSIS — G8929 Other chronic pain: Secondary | ICD-10-CM

## 2021-09-25 NOTE — Progress Notes (Signed)
Office Visit Note   Patient: Stacey Mason           Date of Birth: 07-23-1961           MRN: [id number] Visit Date: 09/25/2021              Requested by: Mayers, Loraine Grip, PA-C [address],  [place] [address] PCP: Gildardo Pounds, NP   Assessment & Plan: Visit Diagnoses: No diagnosis found.  Plan: Deloria is a pleasant 60 year old woman with left greater than right anterior knee pain. She notices it when she has been sitting for awhile and when she kneels down to pray. She has tried tylenol and biofreeze. She says it bagan after a fall.   Follow-Up Instructions: No follow-ups on file.   Orders:  No orders of the defined types were placed in this encounter.  No orders of the defined types were placed in this encounter.     Procedures: No procedures performed   Clinical Data: No additional findings.   Subjective: Chief Complaint  Patient presents with   Right Knee - Pain   Left Knee - Pain  Patient presents today for bilateral knee pain. She said that she has been hurting since July when she fell. She said that the left knee hurts more than the right. She has pain all throughout her knees. She is not taking anything for pain. Her pain is worse when trying to kneel to pray.She denies any instability, or swelling. Her pain is  anterior. Xrays  consistant with bilateral kneecap arthritis. Discussed the natural history. She will begin with learning a Home Exercise program from PT. Could consider injections if she doesn't improve    Review of Systems  Constitutional: Negative.     Objective: Vital Signs: There were no vitals taken for this visit.  Physical Exam Constitutional:      Appearance: Normal appearance.  Eyes:     Extraocular Movements: Extraocular movements intact.  Pulmonary:     Effort: Pulmonary effort is normal.  Neurological:     General: No focal deficit present.     Mental Status: She is alert and oriented to person, place,  and time.    Ortho Exam Bilateral knees no effusion, warmth or cellulitis. Good varus and valgus stability.  Tender along the lateral patellar border and with compression of the patella.  Specialty Comments:  No specialty comments available.  Imaging: No results found.   PMFS History: Patient Active Problem List   Diagnosis Date Noted   Chronic pain of both knees 09/11/2021   Murmur, cardiac 09/11/2021   Prediabetes 09/11/2021   Thrombocytosis 09/11/2021   Class 2 severe obesity due to excess calories with serious comorbidity and body mass index (BMI) of 35.0 to 35.9 in adult [facility]) 09/11/2021   Dermatitis 11/06/2018   Post-menopausal bleeding 11/06/2018   HTN (hypertension) 02/26/2013   Hypothyroidism 02/26/2013   Hyperglycemia 02/26/2013   Hyperlipidemia 02/26/2013   Past Medical History:  Diagnosis Date   Abnormal uterine bleeding (AUB)    Cardiac murmur    Hyperlipidemia    Hypertension    Thyroid disease     Family History  Problem Relation Age of Onset   Diabetes Father    Hyperlipidemia Father    Hypertension Father     Past Surgical History:  Procedure Laterality Date   BREAST SURGERY     Social History   Occupational History   Not on file  Tobacco  Use   Smoking status: Never   Smokeless tobacco: Never  Vaping Use   Vaping Use: Never used  Substance and Sexual Activity   Alcohol use: No   Drug use: No   Sexual activity: Not Currently

## 2021-10-03 ENCOUNTER — Ambulatory Visit: Payer: 59 | Attending: Physician Assistant | Admitting: Physical Therapy

## 2021-10-26 ENCOUNTER — Ambulatory Visit: Payer: Self-pay | Attending: Nurse Practitioner | Admitting: Nurse Practitioner

## 2021-10-26 NOTE — Progress Notes (Signed)
Daughter states patient is not available for call today as she is not feeling well. Declined my offer to reschedule

## 2021-11-07 ENCOUNTER — Encounter: Payer: Self-pay | Admitting: Cardiovascular Disease

## 2021-11-07 ENCOUNTER — Other Ambulatory Visit: Payer: Self-pay

## 2021-11-07 ENCOUNTER — Ambulatory Visit (INDEPENDENT_AMBULATORY_CARE_PROVIDER_SITE_OTHER): Payer: 59 | Admitting: Cardiovascular Disease

## 2021-11-07 VITALS — BP 140/92 | HR 91 | Ht 66.0 in | Wt 204.2 lb

## 2021-11-07 DIAGNOSIS — R0989 Other specified symptoms and signs involving the circulatory and respiratory systems: Secondary | ICD-10-CM

## 2021-11-07 DIAGNOSIS — R0789 Other chest pain: Secondary | ICD-10-CM | POA: Diagnosis not present

## 2021-11-07 DIAGNOSIS — R011 Cardiac murmur, unspecified: Secondary | ICD-10-CM

## 2021-11-07 DIAGNOSIS — I10 Essential (primary) hypertension: Secondary | ICD-10-CM | POA: Diagnosis not present

## 2021-11-07 DIAGNOSIS — E782 Mixed hyperlipidemia: Secondary | ICD-10-CM

## 2021-11-07 NOTE — Progress Notes (Signed)
11/07/2021 Stacey Mason   1961/03/20  [id number]  Primary Physician Gildardo Pounds, NP Primary Cardiologist: Lorretta Harp MD Lupe Carney, [place]  HPI:  Stacey Mason is a 61 y.o. moderately overweight married [place] female mother of 1 daughter, grandmother of 1 grand child who is accompanied by her daughter may today.  She was referred by Geryl Rankins, NP for evaluation of an auscultated murmur.  She has a history of treated hypertension and untreated hyperlipidemia.  There is no family history of heart disease.  She is never had a heart attack or stroke.  She gets occasional atypical right-sided chest pain that occurs infrequently.  Apparently her primary provider heard a murmur on exam yesterday referred her here for further evaluation.   Current Meds  Medication Sig   amLODipine (NORVASC) 10 MG tablet Take 1 tablet (10 mg total) by mouth daily.   gabapentin (NEURONTIN) 300 MG capsule Take 300 mg by mouth at bedtime.   lisinopril (ZESTRIL) 10 MG tablet TAKE 1 TABLET (10 MG TOTAL) BY MOUTH DAILY.   meloxicam (MOBIC) 7.5 MG tablet Take 7.5 mg by mouth 2 (two) times daily as needed for pain.   triamcinolone cream (KENALOG) 0.1 % Apply 1 application topically 2 (two) times daily.   [DISCONTINUED] naproxen (NAPROSYN) 500 MG tablet Take 1 tablet (500 mg total) by mouth every 12 (twelve) hours as needed for mild pain or moderate pain.   [DISCONTINUED] naproxen (NAPROSYN) 500 MG tablet TAKE 1 TABLET (500 MG TOTAL) BY MOUTH EVERY 12 (TWELVE) HOURS AS NEEDED FOR MILD PAIN OR MODERATE PAIN.     No Known Allergies  Social History   Socioeconomic History   Marital status: Married    Spouse name: Not on file   Number of children: Not on file   Years of education: Not on file   Highest education level: Not on file  Occupational History   Not on file  Tobacco Use   Smoking status: Never   Smokeless tobacco: Never  Vaping Use   Vaping Use: Never used  Substance and Sexual  Activity   Alcohol use: No   Drug use: No   Sexual activity: Not Currently  Other Topics Concern   Not on file  Social History Narrative   From [place].  In the [place] since 2003.   Social Determinants of Health   Financial Resource Strain: Not on file  Food Insecurity: Not on file  Transportation Needs: Not on file  Physical Activity: Not on file  Stress: Not on file  Social Connections: Not on file  Intimate Partner Violence: Not on file     Review of Systems: General: negative for chills, fever, night sweats or weight changes.  Cardiovascular: negative for chest pain, dyspnea on exertion, edema, orthopnea, palpitations, paroxysmal nocturnal dyspnea or shortness of breath Dermatological: negative for rash Respiratory: negative for cough or wheezing Urologic: negative for hematuria Abdominal: negative for nausea, vomiting, diarrhea, bright red blood per rectum, melena, or hematemesis Neurologic: negative for visual changes, syncope, or dizziness All other systems reviewed and are otherwise negative except as noted above.    Blood pressure (!) 140/92, pulse 91, height 5\' 6"  (1.676 m), weight 204 lb 3.2 oz (92.6 kg), SpO2 99 %.  General appearance: alert and no distress Neck: no adenopathy, no JVD, supple, symmetrical, trachea midline, thyroid not enlarged, symmetric, no tenderness/mass/nodules, and bilateral carotid bruits versus transmitted murmur Lungs: clear to auscultation bilaterally Heart: 2/6 outflow tract murmur consistent with  aortic stenosis atherosclerosis. Extremities: extremities normal, atraumatic, no cyanosis or edema Pulses: 2+ and symmetric Skin: Skin color, texture, turgor normal. No rashes or lesions Neurologic: Grossly normal  EKG sinus rhythm at 91 with nonspecific ST and T wave changes.  I personally reviewed this EKG.  ASSESSMENT AND PLAN:   HTN (hypertension) History of essential hypertension a blood pressure measured today at 140/92.  She is on  lisinopril and amlodipine.  Hyperlipidemia History of hyperlipidemia not on statin therapy with lipid profile performed 04/18/2021 revealing total cholesterol of 219, LDL 153 and HDL 41.  I am going to get a coronary calcium score to help guide aggressiveness of risk factor modification.  Murmur, cardiac Patient has a soft outflow tract murmur.  I am going to get a 2D echo to further evaluate     Lorretta Harp MD [facility], [facility] 11/07/2021 4:13 PM

## 2021-11-07 NOTE — Patient Instructions (Signed)
Medication Instructions:  Your physician recommends that you continue on your current medications as directed. Please refer to the Current Medication list given to you today.  *If you need a refill on your cardiac medications before your next appointment, please call your pharmacy*   Testing/Procedures: Your physician has requested that you have a carotid duplex. This test is an ultrasound of the carotid arteries in your neck. It looks at blood flow through these arteries that supply the brain with blood. Allow one hour for this exam. There are no restrictions or special instructions. This procedure is done at [facility].   Your physician has requested that you have an echocardiogram. Echocardiography is a painless test that uses sound waves to create images of your heart. It provides your doctor with information about the size and shape of your heart and how well your hearts chambers and valves are working. This procedure takes approximately one hour. There are no restrictions for this procedure. This procedure is done at [address]   Dr. Gwenlyn Found has ordered a CT coronary calcium score.   Test location:   [facility] ([address], [place] [address])  This is $99 out of pocket.   Coronary CalciumScan A coronary calcium scan is an imaging test used to look for deposits of calcium and other fatty materials (plaques) in the inner lining of the blood vessels of the heart (coronary arteries). These deposits of calcium and plaques can partly clog and narrow the coronary arteries without producing any symptoms or warning signs. This puts a person at risk for a heart attack. This test can detect these deposits before symptoms develop. Tell a health care provider about: Any allergies you have. All medicines you are taking, including vitamins, herbs, eye drops, creams, and over-the-counter medicines. Any problems you or family members have had with anesthetic  medicines. Any blood disorders you have. Any surgeries you have had. Any medical conditions you have. Whether you are pregnant or may be pregnant. What are the risks? Generally, this is a safe procedure. However, problems may occur, including: Harm to a pregnant woman and her unborn baby. This test involves the use of radiation. Radiation exposure can be dangerous to a pregnant woman and her unborn baby. If you are pregnant, you generally should not have this procedure done. Slight increase in the risk of cancer. This is because of the radiation involved in the test. What happens before the procedure? No preparation is needed for this procedure. What happens during the procedure? You will undress and remove any jewelry around your neck or chest. You will put on a hospital gown. Sticky electrodes will be placed on your chest. The electrodes will be connected to an electrocardiogram (ECG) machine to record a tracing of the electrical activity of your heart. A CT scanner will take pictures of your heart. During this time, you will be asked to lie still and hold your breath for 2-3 seconds while a picture of your heart is being taken. The procedure may vary among health care providers and hospitals. What happens after the procedure? You can get dressed. You can return to your normal activities. It is up to you to get the results of your test. Ask your health care provider, or the department that is doing the test, when your results will be ready. Summary A coronary calcium scan is an imaging test used to look for deposits of calcium and other fatty materials (plaques) in the inner  lining of the blood vessels of the heart (coronary arteries). Generally, this is a safe procedure. Tell your health care provider if you are pregnant or may be pregnant. No preparation is needed for this procedure. A CT scanner will take pictures of your heart. You can return to your normal activities after the scan  is done. This information is not intended to replace advice given to you by your health care provider. Make sure you discuss any questions you have with your health care provider. Document Released: 03/28/2008 Document Revised: 08/19/2016 Document Reviewed: 08/19/2016 Elsevier Interactive Patient Education  2017 [place]: At [facility], you and your health needs are our priority.  As part of our continuing mission to provide you with exceptional heart care, we have created designated Provider Care Teams.  These Care Teams include your primary Cardiologist (physician) and Advanced Practice Providers (APPs -  Physician Assistants and Nurse Practitioners) who all work together to provide you with the care you need, when you need it.  We recommend signing up for the patient portal called "MyChart".  Sign up information is provided on this After Visit Summary.  MyChart is used to connect with patients for Virtual Visits (Telemedicine).  Patients are able to view lab/test results, encounter notes, upcoming appointments, etc.  Non-urgent messages can be sent to your provider as well.   To learn more about what you can do with MyChart, go to [email or web address].    Your next appointment:   4 week(s)  The format for your next appointment:   In Person  Provider:   Quay Burow, MD

## 2021-11-07 NOTE — Assessment & Plan Note (Signed)
History of hyperlipidemia not on statin therapy with lipid profile performed 04/18/2021 revealing total cholesterol of 219, LDL 153 and HDL 41.  I am going to get a coronary calcium score to help guide aggressiveness of risk factor modification.

## 2021-11-07 NOTE — Assessment & Plan Note (Signed)
Patient has a soft outflow tract murmur.  I am going to get a 2D echo to further evaluate

## 2021-11-07 NOTE — Assessment & Plan Note (Signed)
History of essential hypertension a blood pressure measured today at 140/92.  She is on lisinopril and amlodipine.

## 2021-11-15 ENCOUNTER — Other Ambulatory Visit: Payer: Self-pay

## 2021-11-15 ENCOUNTER — Ambulatory Visit (HOSPITAL_COMMUNITY)
Admission: RE | Admit: 2021-11-15 | Discharge: 2021-11-15 | Disposition: A | Discharge status: Home / Self Care | Payer: 59 | Source: Ambulatory Visit | Attending: Cardiovascular Disease | Admitting: Cardiovascular Disease

## 2021-11-15 DIAGNOSIS — R0989 Other specified symptoms and signs involving the circulatory and respiratory systems: Secondary | ICD-10-CM | POA: Insufficient documentation

## 2021-11-22 ENCOUNTER — Encounter: Payer: Self-pay | Admitting: *Deleted

## 2021-11-28 ENCOUNTER — Ambulatory Visit (INDEPENDENT_AMBULATORY_CARE_PROVIDER_SITE_OTHER)
Admission: RE | Admit: 2021-11-28 | Discharge: 2021-11-28 | Disposition: A | Discharge status: Home / Self Care | Payer: Self-pay | Source: Ambulatory Visit | Attending: Cardiovascular Disease | Admitting: Cardiovascular Disease

## 2021-11-28 ENCOUNTER — Other Ambulatory Visit: Payer: Self-pay

## 2021-11-28 ENCOUNTER — Ambulatory Visit (HOSPITAL_COMMUNITY): Payer: 59 | Attending: Internal Medicine

## 2021-11-28 DIAGNOSIS — R0789 Other chest pain: Secondary | ICD-10-CM

## 2021-11-28 DIAGNOSIS — R011 Cardiac murmur, unspecified: Secondary | ICD-10-CM | POA: Insufficient documentation

## 2021-11-28 DIAGNOSIS — R0989 Other specified symptoms and signs involving the circulatory and respiratory systems: Secondary | ICD-10-CM

## 2021-11-28 LAB — ECHOCARDIOGRAM COMPLETE
AR max vel: 1.1 cm2
AV Area VTI: 1.35 cm2
AV Area mean vel: 1.23 cm2
AV Mean grad: 10 mmHg
AV Peak grad: 20.3 mmHg
Ao pk vel: 2.25 m/s
Area-P 1/2: 5.02 cm2
S' Lateral: 2.5 cm

## 2021-12-04 ENCOUNTER — Ambulatory Visit: Payer: 59 | Admitting: Cardiovascular Disease

## 2021-12-18 ENCOUNTER — Encounter: Payer: Self-pay | Admitting: Cardiovascular Disease

## 2021-12-18 ENCOUNTER — Other Ambulatory Visit: Payer: Self-pay

## 2021-12-18 ENCOUNTER — Ambulatory Visit (INDEPENDENT_AMBULATORY_CARE_PROVIDER_SITE_OTHER): Payer: 59 | Admitting: Cardiovascular Disease

## 2021-12-18 VITALS — BP 128/72 | HR 87 | Ht 66.0 in | Wt 200.0 lb

## 2021-12-18 DIAGNOSIS — R011 Cardiac murmur, unspecified: Secondary | ICD-10-CM | POA: Diagnosis not present

## 2021-12-18 DIAGNOSIS — I10 Essential (primary) hypertension: Secondary | ICD-10-CM | POA: Diagnosis not present

## 2021-12-18 DIAGNOSIS — R0789 Other chest pain: Secondary | ICD-10-CM | POA: Diagnosis not present

## 2021-12-18 DIAGNOSIS — E782 Mixed hyperlipidemia: Secondary | ICD-10-CM

## 2021-12-18 MED ORDER — ATORVASTATIN CALCIUM 20 MG PO TABS: 20.0000 mg | ORAL_TABLET | Freq: Every evening | ORAL | 4 refills | Status: DC

## 2021-12-18 NOTE — Patient Instructions (Signed)
Medication Instructions:  ? ?-Start atorvastatin (lipitor) '20mg'$  once daily. ? ?*If you need a refill on your cardiac medications before your next appointment, please call your pharmacy* ? ? ?Lab Work: ?Your physician recommends that you return for lab work in: 3 months for FASTING lipid/liver profile. ? ?If you have labs (blood work) drawn today and your tests are completely normal, you will receive your results only by: ?MyChart Message (if you have MyChart) OR ?A paper copy in the mail ?If you have any lab test that is abnormal or we need to change your treatment, we will call you to review the results. ? ? ?Testing/Procedures: ?Your physician has requested that you have an echocardiogram. Echocardiography is a painless test that uses sound waves to create images of your heart. It provides your doctor with information about the size and shape of your heart and how well your heart?s chambers and valves are working. This procedure takes approximately one hour. There are no restrictions for this procedure. To be done in Feb 2024. This procedure is done at [address] ? ? ? ?Follow-Up: ?At [facility], you and your health needs are our priority.  As part of our continuing mission to provide you with exceptional heart care, we have created designated Provider Care Teams.  These Care Teams include your primary Cardiologist (physician) and Advanced Practice Providers (APPs -  Physician Assistants and Nurse Practitioners) who all work together to provide you with the care you need, when you need it. ? ?We recommend signing up for the patient portal called "MyChart".  Sign up information is provided on this After Visit Summary.  MyChart is used to connect with patients for Virtual Visits (Telemedicine).  Patients are able to view lab/test results, encounter notes, upcoming appointments, etc.  Non-urgent messages can be sent to your provider as well.   ?To learn more about what you can do with MyChart, go  to [email or web address].   ? ?Your next appointment:   ?6 month(s) ? ?The format for your next appointment:   ?In Person ? ?Provider:   ?Quay Burow, MD ? ?

## 2021-12-18 NOTE — Progress Notes (Signed)
? ? ? ?Lorretta Harp MD FACP,FACC,FAHA, FSCAI ?12/18/2021 ?10:12 AM ? ? ? ?12/18/2021 ?Stacey Mason   ?[birth year]  ?[id number] ? ?Primary Physician Stacey Pounds, NP ?Primary Cardiologist: Lorretta Harp MD Stacey Mason, [place] ? ?HPI:  Stacey Mason is a 61 y.o.  moderately overweight married [place] female mother of 1 daughter, grandmother of 1 grand child who is accompanied by her daughter Stacey Mason  today.  She was referred by Stacey Rankins, NP for evaluation of an auscultated murmur.  She has a history of treated hypertension and untreated hyperlipidemia.  There is no family history of heart disease.  She is never had a heart attack or stroke.  She gets occasional atypical right-sided chest pain that occurs infrequently.  Apparently her primary provider heard a murmur on exam yesterday referred her here for further evaluation. ? ?Since I saw her 3 months ago I did get a coronary calcium score which was 0.  In addition, a 2D echo was obtained to evaluate her murmur which revealed mild aortic stenosis on 11/28/2021.  Her aortic valve area was 1.35 cm grade with a peak gradient of 20 mmHg.  Carotid Dopplers showed no evidence of ICA stenosis. ?  ? ? ?Current Meds  ?Medication Sig  ? amLODipine (NORVASC) 10 MG tablet Take 1 tablet (10 mg total) by mouth daily.  ? atorvastatin (LIPITOR) 20 MG tablet Take 1 tablet (20 mg total) by mouth every evening.  ? gabapentin (NEURONTIN) 300 MG capsule Take 300 mg by mouth at bedtime.  ? levothyroxine (SYNTHROID) 200 MCG tablet Take 1 tablet (200 mcg total) by mouth daily before breakfast.  ? lisinopril (ZESTRIL) 10 MG tablet TAKE 1 TABLET (10 MG TOTAL) BY MOUTH DAILY.  ? meloxicam (MOBIC) 7.5 MG tablet Take 7.5 mg by mouth 2 (two) times daily as needed for pain.  ? triamcinolone cream (KENALOG) 0.1 % Apply 1 application topically 2 (two) times daily.  ?  ? ?No Known Allergies ? ?Social History  ? ?Socioeconomic History  ? Marital status: Married  ?  Spouse name: Not on file   ? Number of children: Not on file  ? Years of education: Not on file  ? Highest education level: Not on file  ?Occupational History  ? Not on file  ?Tobacco Use  ? Smoking status: Never  ? Smokeless tobacco: Never  ?Vaping Use  ? Vaping Use: Never used  ?Substance and Sexual Activity  ? Alcohol use: No  ? Drug use: No  ? Sexual activity: Not Currently  ?Other Topics Concern  ? Not on file  ?Social History Narrative  ? From [place].  In the [place] since 2003.  ? ?Social Determinants of Health  ? ?Financial Resource Strain: Not on file  ?Food Insecurity: Not on file  ?Transportation Needs: Not on file  ?Physical Activity: Not on file  ?Stress: Not on file  ?Social Connections: Not on file  ?Intimate Partner Violence: Not on file  ?  ? ?Review of Systems: ?General: negative for chills, fever, night sweats or weight changes.  ?Cardiovascular: negative for chest pain, dyspnea on exertion, edema, orthopnea, palpitations, paroxysmal nocturnal dyspnea or shortness of breath ?Dermatological: negative for rash ?Respiratory: negative for cough or wheezing ?Urologic: negative for hematuria ?Abdominal: negative for nausea, vomiting, diarrhea, bright red blood per rectum, melena, or hematemesis ?Neurologic: negative for visual changes, syncope, or dizziness ?All other systems reviewed and are otherwise negative except as noted above. ? ? ? ?Blood pressure 128/72, pulse 87,  height '5\' 6"'$  (1.676 m), weight 200 lb (90.7 kg).  ?General appearance: alert and no distress ?Neck: no adenopathy, no carotid bruit, no JVD, supple, symmetrical, trachea midline, and thyroid not enlarged, symmetric, no tenderness/mass/nodules ?Lungs: clear to auscultation bilaterally ?Heart: 2/6 outflow tract murmur consistent with aortic stenosis. ?Extremities: extremities normal, atraumatic, no cyanosis or edema ?Pulses: 2+ and symmetric ?Skin: Skin color, texture, turgor normal. No rashes or lesions ?Neurologic: Grossly normal ? ?EKG not performed  today ? ?ASSESSMENT AND PLAN:  ? ?HTN (hypertension) ?History of essential hypertension a blood pressure measured today at 128/72.  She is on amlodipine and lisinopril. ? ?Hyperlipidemia ?History of hyperlipidemia currently not on statin therapy with lipid profile performed 04/18/2021 revealing total cholesterol 219, LDL 153 and HDL 41.  Her recent coronary calcium score performed 11/28/2021 was 0.  I am going to start her on atorvastatin 20 mg a day and we will we will recheck a lipid liver profile in 3 months. ? ?Murmur, cardiac ?History of cardiac murmur with 2D echo performed 11/28/2021 revealed mild aortic stenosis with normal LV systolic function.  Her valve area calculated 1.35 cm? with a peak gradient of 20 mmHg.  This will be rechecked in February of next year. ? ?Atypical chest pain ?Coronary calcium score of 0.  Pain is sharp and pain like, fairly focal.  I reassured her that the likelihood is that this is not ischemically mediated. ? ? ? ? ? ?Lorretta Harp MD FACP,FACC,FAHA, FSCAI ?12/18/2021 ?10:12 AM ?

## 2021-12-18 NOTE — Assessment & Plan Note (Signed)
History of cardiac murmur with 2D echo performed 11/28/2021 revealed mild aortic stenosis with normal LV systolic function.  Her valve area calculated 1.35 cm? with a peak gradient of 20 mmHg.  This will be rechecked in February of next year. ?

## 2021-12-18 NOTE — Assessment & Plan Note (Signed)
History of hyperlipidemia currently not on statin therapy with lipid profile performed 04/18/2021 revealing total cholesterol 219, LDL 153 and HDL 41.  Her recent coronary calcium score performed 11/28/2021 was 0.  I am going to start her on atorvastatin 20 mg a day and we will we will recheck a lipid liver profile in 3 months. ?

## 2021-12-18 NOTE — Assessment & Plan Note (Signed)
History of essential hypertension a blood pressure measured today at 128/72.  She is on amlodipine and lisinopril. ?

## 2021-12-18 NOTE — Assessment & Plan Note (Signed)
Coronary calcium score of 0.  Pain is sharp and pain like, fairly focal.  I reassured her that the likelihood is that this is not ischemically mediated. ?

## 2021-12-20 ENCOUNTER — Ambulatory Visit (HOSPITAL_COMMUNITY): Payer: 59

## 2022-01-03 ENCOUNTER — Other Ambulatory Visit: Payer: Self-pay

## 2022-01-11 ENCOUNTER — Other Ambulatory Visit: Payer: Self-pay | Admitting: Nurse Practitioner

## 2022-01-11 ENCOUNTER — Other Ambulatory Visit: Payer: Self-pay

## 2022-01-11 DIAGNOSIS — E039 Hypothyroidism, unspecified: Secondary | ICD-10-CM

## 2022-01-14 NOTE — Telephone Encounter (Signed)
Requested medications are due for refill today.  yes ? ?Requested medications are on the active medications list.  yes ? ?Last refill. 09/11/2021 ? ?Future visit scheduled.   no ? ?Notes to clinic.  Abnormal labs. Prescription expired 12/18/2021 ? ? ? ?Requested Prescriptions  ?Pending Prescriptions Disp Refills  ? levothyroxine (SYNTHROID) 200 MCG tablet 30 tablet 1  ?  Sig: Take 1 tablet (200 mcg total) by mouth daily before breakfast.  ?  ? There is no refill protocol information for this order  ?  ?  ?

## 2022-01-15 ENCOUNTER — Other Ambulatory Visit: Payer: Self-pay

## 2022-01-15 MED ORDER — LEVOTHYROXINE SODIUM 200 MCG PO TABS
200.0000 ug | ORAL_TABLET | Freq: Every day | ORAL | 0 refills | Status: DC
  Filled 2022-01-15: qty 30, 30d supply, fill #0

## 2022-01-17 ENCOUNTER — Other Ambulatory Visit: Payer: Self-pay

## 2022-04-26 ENCOUNTER — Other Ambulatory Visit: Payer: Self-pay

## 2022-06-21 ENCOUNTER — Ambulatory Visit: Payer: 59 | Attending: Cardiovascular Disease | Admitting: Cardiovascular Disease

## 2022-12-11 ENCOUNTER — Telehealth (HOSPITAL_COMMUNITY): Payer: Self-pay | Admitting: Cardiovascular Disease

## 2022-12-11 NOTE — Telephone Encounter (Signed)
I called patient to schedule Echocardiogram for March. I spoke with daughter.  Patient has changed insurance and she will check to see if we are still in [place] thru [facility]. Daughter will cll me back if we are. If not she will be changing cardiologist. Order will be removed from the active echo WQ and if pt calls back we will reinstate the order. Thank you

## 2023-06-20 NOTE — Progress Notes (Signed)
This encounter was created in error - please disregard.

## 2024-06-25 ENCOUNTER — Telehealth: Payer: Self-pay

## 2024-06-25 NOTE — Telephone Encounter (Signed)
 Copied from CRM [id number]. Topic: Appointments - Transfer of Care >> Jun 25, 2024  2:15 PM [place] E wrote: Pt is requesting to transfer FROM: Haze Servant, NP Pt is requesting to transfer TO: Cleatus Specking, MD Reason for requested transfer: Specific reason was not stated. It is the responsibility of the team the patient would like to transfer to (Dr. Cleatus Specking) to reach out to the patient if for any reason this transfer is not acceptable.

## 2024-06-25 NOTE — Telephone Encounter (Signed)
 Patient has appointment 06/29/24 with Dr. Jerrell.

## 2024-06-29 ENCOUNTER — Encounter: Payer: Self-pay | Admitting: Student in an Organized Health Care Education/Training Program

## 2024-06-29 ENCOUNTER — Ambulatory Visit (INDEPENDENT_AMBULATORY_CARE_PROVIDER_SITE_OTHER): Payer: Self-pay | Admitting: Student in an Organized Health Care Education/Training Program

## 2024-06-29 VITALS — BP 184/100 | HR 96

## 2024-06-29 DIAGNOSIS — E039 Hypothyroidism, unspecified: Secondary | ICD-10-CM

## 2024-06-29 DIAGNOSIS — E66812 Obesity, class 2: Secondary | ICD-10-CM | POA: Diagnosis not present

## 2024-06-29 DIAGNOSIS — M791 Myalgia, unspecified site: Secondary | ICD-10-CM | POA: Insufficient documentation

## 2024-06-29 DIAGNOSIS — E782 Mixed hyperlipidemia: Secondary | ICD-10-CM

## 2024-06-29 DIAGNOSIS — R7303 Prediabetes: Secondary | ICD-10-CM

## 2024-06-29 DIAGNOSIS — I10 Essential (primary) hypertension: Secondary | ICD-10-CM | POA: Diagnosis not present

## 2024-06-29 MED ORDER — LOSARTAN POTASSIUM-HCTZ 50-12.5 MG PO TABS: 1.0000 | ORAL_TABLET | Freq: Every day | ORAL | 3 refills | Status: AC

## 2024-06-29 MED ORDER — LEVOTHYROXINE SODIUM 50 MCG PO TABS: 50.0000 ug | ORAL_TABLET | Freq: Every day | ORAL | 3 refills | Status: AC

## 2024-06-29 NOTE — Patient Instructions (Signed)
  VISIT SUMMARY: Today, we discussed your generalized pain, medication refills, and several ongoing health issues including hypertension, thyroid  issues, and insomnia. We also reviewed your history of arthritis and knee pain following a fall in 2022.  YOUR PLAN: -HYPERTENSION: Hypertension means high blood pressure. Your blood pressure is currently high at 185/100 mmHg. We are prescribing Hyzaar to help lower your blood pressure by 20-30 points. Please recheck your blood pressure in 3-4 weeks and contact us  if you run out of medication. We may consider adding another medication in the future to reach a target blood pressure of 120 mmHg.  -HYPOTHYROIDISM: Hypothyroidism means your thyroid  is underactive. We are prescribing Synthroid  50 mcg daily to help manage your thyroid  levels. We will check your thyroid  function today and recheck it in 4-6 weeks to ensure the medication is working properly.  -GENERALIZED PAIN AND ARTHRALGIA: Generalized pain and arthralgia refer to widespread pain and joint pain. Your joints appear healthy, suggesting regular arthritis. We will order blood work to check for rheumatoid arthritis and other inflammatory conditions.  -KNEE OSTEOARTHRITIS: Knee osteoarthritis is a condition where the cartilage in your knee wears down over time. You have moderate knee osteoarthritis with ongoing symptoms since your fall in 2022.  -INSOMNIA: Insomnia means having trouble sleeping. You are currently sleeping only 4-5 hours per night. We advise practicing good sleep hygiene, including planning for 7-8 hours of sleep each night.  -GENERAL HEALTH MAINTENANCE: It is important to get enough sleep for your overall health and functioning. We also recommend following up on your routine labs and maintaining a healthy lifestyle.  INSTRUCTIONS: Please follow up in 3-4 weeks to recheck your blood pressure and in 4-6 weeks to reassess your thyroid  function. Contact us  if you run out of your  medications.

## 2024-06-29 NOTE — Assessment & Plan Note (Addendum)
 Her hypertension is poorly controlled with a current reading of 185/100 mmHg.  She has been off all medications for many months.  Previous use of amlodipine  may have caused swelling. Prescribed Hyzaar for better compliance and control, expecting a 20-30 point reduction in blood pressure. Recheck blood pressure in 3-4 weeks. Advise her to contact me if medication runs out. Consider adding a third medication in the future to achieve a target systolic of 120 mmHg.  Will check BMP and microalbumin today.

## 2024-06-29 NOTE — Assessment & Plan Note (Signed)
 She has long-standing hypothyroidism, sounds like a history of Hashimoto's.  Previous dose of Synthroid  was reportedly 50 mcg daily.  She has done pretty well symptomatically without Synthroid  medication over the last several months.  Will recheck TSH and free T4 today, perhaps she is back to subclinical hyperthyroidism.  Will restart Synthroid  at the low dose of 50 mcg daily depending on that blood work.

## 2024-06-29 NOTE — Progress Notes (Signed)
 New Patient Office Visit  Subjective    Patient ID: Stacey Mason, female    DOB: 11-03-1960  Age: 63 y.o. MRN: [id number]  CC: Hypertension  HPI  Discussed the use of AI scribe software for clinical note transcription with the patient, who gave verbal consent to proceed.  History of Present Illness Stacey Mason is a 63 year old female with hypertension and thyroid  issues who presents with generalized pain and medication refill needs.  She experiences generalized pain affecting her feet, legs, back, and shoulders. Her leg pain is exacerbated by a history of a herniated disc, with radiation down her thighs to her legs. She attributes some discomfort to being out of her prescribed medications for hypertension and thyroid  issues for approximately six and a half months.  She has hypertension and has been off her prescribed lisinopril  10 mg for several months. She occasionally feels 'tightness in her chest', though not associated with exercise. No recent chest pain is reported.  Her thyroid  issues have been present since 2009, with no thyroid  procedures performed. She was previously on Synthroid , with her dose adjusted from 150 mcg to 50 mcg after blood tests. She reports a recurring rash associated with her thyroid  condition, appearing as dark, dry skin after scratching.  She experiences insomnia, often staying awake until 3 or 4 AM and only sleeping for 4 to 5 hours per night. This lack of sleep is compounded by her responsibilities at home, including caring for her grandchildren, which disrupts her sleep further.  She denies any history of heart attack or stroke, though she was evaluated by a cardiologist for a heart murmur, which was deemed non-defective. She has no history of diabetes or blood sugar issues, but notes her weight has been increasing.  She has a history of arthritis, with swelling in her joints, particularly her knees. She fell in 2022 and has had ongoing pain since then. She  uses Tylenol  and ibuprofen  as needed for pain relief.    Outpatient Encounter Medications as of 06/29/2024  Medication Sig   levothyroxine  (SYNTHROID ) 50 MCG tablet Take 1 tablet (50 mcg total) by mouth daily.   losartan -hydrochlorothiazide  (HYZAAR) 50-12.5 MG tablet Take 1 tablet by mouth daily.   atorvastatin  (LIPITOR) 20 MG tablet Take 1 tablet (20 mg total) by mouth every evening.   [DISCONTINUED] amLODipine  (NORVASC ) 10 MG tablet Take 1 tablet (10 mg total) by mouth daily.   [DISCONTINUED] gabapentin (NEURONTIN) 300 MG capsule Take 300 mg by mouth at bedtime.   [DISCONTINUED] levothyroxine  (SYNTHROID ) 200 MCG tablet Take 1 tablet (200 mcg total) by mouth daily before breakfast.   [DISCONTINUED] lisinopril  (ZESTRIL ) 10 MG tablet TAKE 1 TABLET (10 MG TOTAL) BY MOUTH DAILY.   [DISCONTINUED] meloxicam  (MOBIC ) 7.5 MG tablet Take 7.5 mg by mouth 2 (two) times daily as needed for pain.   [DISCONTINUED] triamcinolone  cream (KENALOG ) 0.1 % Apply 1 application topically 2 (two) times daily.   No facility-administered encounter medications on file as of 06/29/2024.    Past Medical History:  Diagnosis Date   Abnormal uterine bleeding (AUB)    Cardiac murmur    Hyperlipidemia    Hypertension    Thyroid  disease     Past Surgical History:  Procedure Laterality Date   BREAST SURGERY      Family History  Problem Relation Age of Onset   Diabetes Father    Hyperlipidemia Father    Hypertension Father        Objective    BP ROLLEN)  184/100 (Cuff Size: Large)   Pulse 96   Physical Exam  Gen: Well-appearing woman Neck: Normal thyroid , no nodules or adenopathy Heart: Regular, 3 out of 6 early stock murmur best heard at the right upper sternal border Lungs: Unlabored, clear throughout Ext: Warm, no edema MSK: Diffuse joint pain is reported, no effusions or synovitis noted on her joints.  She has anterior crepitus in both knees.  She has point tenderness over the muscles in her shoulders  and thighs. Skin: Mild xerosis on both lower extremities    Assessment & Plan:    Problem List Items Addressed This Visit       High   HTN (hypertension) - Primary (Chronic)   Her hypertension is poorly controlled with a current reading of 185/100 mmHg.  She has been off all medications for many months.  Previous use of amlodipine  may have caused swelling. Prescribed Hyzaar for better compliance and control, expecting a 20-30 point reduction in blood pressure. Recheck blood pressure in 3-4 weeks. Advise her to contact me if medication runs out. Consider adding a third medication in the future to achieve a target systolic of 120 mmHg.  Will check BMP and microalbumin today.      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  Previous dose of Synthroid  was reportedly 50 mcg daily.  She has done pretty well symptomatically without Synthroid  medication over the last several months.  Will recheck TSH and free T4 today, perhaps she is back to subclinical hyperthyroidism.  Will restart Synthroid  at the low dose of 50 mcg daily depending on that blood work.      Relevant Medications   levothyroxine  (SYNTHROID ) 50 MCG tablet   Other Relevant Orders   TSH   T4, free   Prediabetes (Chronic)   Patient is at high risk for progression to diabetes.  Check A1c today.      Relevant Orders   Hemoglobin A1c   Myalgia (Chronic)   Patient with a sensation of diffuse myalgias and arthralgias.  Exam is reassuring with no signs of synovitis.  Will check rheumatoid factor and CCP.  Diffuse myalgias raise the risk of polymyalgia rheumatica, will check inflammatory markers.  If those returned  normal, I suspect this might be fibromyalgia.  I am hopeful that restarting medications for her uncontrolled hypertension and hypothyroidism may improve the symptoms.  We also talked about the contributions of mood, inactivity, difficulties with sleep, and stress all contributing to her chronic discomfort.      Relevant Orders   Sedimentation rate   C-reactive protein   Rheumatoid Factor   Cyclic citrul peptide antibody, IgG ([facility])   Hyperlipidemia (Chronic)   Relevant Medications   losartan -hydrochlorothiazide  (HYZAAR) 50-12.5 MG tablet   Other Relevant Orders   Lipid panel    Return in about 4 weeks (around 07/27/2024).   Cleatus Debby Specking, MD

## 2024-06-29 NOTE — Assessment & Plan Note (Signed)
 Patient with a sensation of diffuse myalgias and arthralgias.  Exam is reassuring with no signs of synovitis.  Will check rheumatoid factor and CCP.  Diffuse myalgias raise the risk of polymyalgia rheumatica, will check inflammatory markers.  If those returned normal, I suspect this might be fibromyalgia.  I am hopeful that restarting medications for her uncontrolled hypertension and hypothyroidism may improve the symptoms.  We also talked about the contributions of mood, inactivity, difficulties with sleep, and stress all contributing to her chronic discomfort.

## 2024-06-29 NOTE — Assessment & Plan Note (Signed)
 Patient is at high risk for progression to diabetes.  Check A1c today.

## 2024-06-30 LAB — COMPREHENSIVE METABOLIC PANEL WITH GFR
ALT: 10 U/L (ref 0–35)
AST: 12 U/L (ref 0–37)
Albumin: 4.1 g/dL (ref 3.5–5.2)
Alkaline Phosphatase: 106 U/L (ref 39–117)
BUN: 11 mg/dL (ref 6–23)
CO2: 28 meq/L (ref 19–32)
Calcium: 9.6 mg/dL (ref 8.4–10.5)
Chloride: 104 meq/L (ref 96–112)
Creatinine, Ser: 0.71 mg/dL (ref 0.40–1.20)
GFR: 91.8 mL/min (ref >60.00)
Glucose, Bld: 133 mg/dL — ABNORMAL HIGH (ref 70–99)
Potassium: 4.3 meq/L (ref 3.5–5.1)
Sodium: 142 meq/L (ref 135–145)
Total Bilirubin: 0.3 mg/dL (ref 0.2–1.2)
Total Protein: 7.3 g/dL (ref 6.0–8.3)

## 2024-06-30 LAB — LIPID PANEL
Cholesterol: 221 mg/dL — ABNORMAL HIGH (ref 0–200)
HDL: 43.1 mg/dL (ref >39.00)
LDL Cholesterol: 145 mg/dL — ABNORMAL HIGH (ref 0–99)
NonHDL: 177.72
Total CHOL/HDL Ratio: 5
Triglycerides: 165 mg/dL — ABNORMAL HIGH (ref 0.0–149.0)
VLDL: 33 mg/dL (ref 0.0–40.0)

## 2024-06-30 LAB — HEMOGLOBIN A1C: Hgb A1c MFr Bld: 6.7 % — ABNORMAL HIGH (ref 4.6–6.5)

## 2024-06-30 LAB — T4, FREE: Free T4: 0.72 ng/dL (ref 0.60–1.60)

## 2024-06-30 LAB — SEDIMENTATION RATE: Sed Rate: 40 mm/h — ABNORMAL HIGH (ref 0–30)

## 2024-06-30 LAB — C-REACTIVE PROTEIN: CRP: 1.2 mg/dL (ref 0.5–20.0)

## 2024-06-30 LAB — TSH: TSH: 15.13 u[IU]/mL — ABNORMAL HIGH (ref 0.35–5.50)

## 2024-07-01 LAB — CYCLIC CITRUL PEPTIDE ANTIBODY, IGG: Cyclic Citrullin Peptide Ab: <16 U

## 2024-07-01 LAB — RHEUMATOID FACTOR: Rheumatoid fact SerPl-aCnc: <10 [IU]/mL (ref <14)

## 2024-07-02 ENCOUNTER — Ambulatory Visit: Payer: Self-pay | Admitting: Student in an Organized Health Care Education/Training Program

## 2024-07-02 NOTE — Telephone Encounter (Signed)
 Lab results have been sent out by mail to patient

## 2024-07-27 ENCOUNTER — Ambulatory Visit: Admitting: Student in an Organized Health Care Education/Training Program

## 2024-07-27 ENCOUNTER — Encounter: Payer: Self-pay | Admitting: Student in an Organized Health Care Education/Training Program

## 2024-07-27 VITALS — BP 120/69 | HR 101 | Ht 66.0 in | Wt 211.0 lb

## 2024-07-27 DIAGNOSIS — M545 Low back pain, unspecified: Secondary | ICD-10-CM

## 2024-07-27 DIAGNOSIS — E039 Hypothyroidism, unspecified: Secondary | ICD-10-CM | POA: Diagnosis not present

## 2024-07-27 DIAGNOSIS — I10 Essential (primary) hypertension: Secondary | ICD-10-CM

## 2024-07-27 DIAGNOSIS — E119 Type 2 diabetes mellitus without complications: Secondary | ICD-10-CM | POA: Diagnosis not present

## 2024-07-27 DIAGNOSIS — G8929 Other chronic pain: Secondary | ICD-10-CM

## 2024-07-27 DIAGNOSIS — E782 Mixed hyperlipidemia: Secondary | ICD-10-CM

## 2024-07-27 MED ORDER — ROSUVASTATIN CALCIUM 10 MG PO TABS: 10.0000 mg | ORAL_TABLET | Freq: Every day | ORAL | 3 refills | Status: AC

## 2024-07-27 MED ORDER — DULOXETINE HCL 30 MG PO CPEP: 30.0000 mg | ORAL_CAPSULE | Freq: Every day | ORAL | 1 refills | Status: DC

## 2024-07-27 NOTE — Assessment & Plan Note (Signed)
 The 10-year ASCVD risk score (Arnett DK, et al., 2019) is: 10.2%  LDL cholesterol is 145, above target, increasing cardiovascular risk. The benefits of statin therapy are discussed. Prescribed rosuvastatin  for cholesterol management and discuss potential side effects, including muscle aches. Monitor cholesterol levels regularly.  This is for primary prevention of an ischemic event.

## 2024-07-27 NOTE — Assessment & Plan Note (Signed)
 A recent A1c of 6.7% indicates early-stage diabetes, with a history of prediabetes. There is no current need for medication, but lifestyle modifications are crucial. Recheck A1c every 3-4 months and encouraged dietary changes to reduce sugar and carbohydrate intake, along with regular exercise.

## 2024-07-27 NOTE — Assessment & Plan Note (Signed)
 TSH at 15 and T4 at 0.7, we restarted levothyroxine  50 mcg daily.  There has been some improvement in her fatigue.  Will recheck TSH today.

## 2024-07-27 NOTE — Assessment & Plan Note (Signed)
 Blood pressure is well-controlled with the current medication regimen.  Much improved compared to last visit.  Currently 120/69.  Continue losartan -hydrochlorothiazide  50-12.5 MG orally daily.  Check BMP today.

## 2024-07-27 NOTE — Patient Instructions (Signed)
  VISIT SUMMARY: Today, we discussed your persistent body pain, medication management, and several health concerns including hypothyroidism, hypertension, high cholesterol, early-stage diabetes, and weight management. We reviewed your current medications and recent lab results, and we have made some adjustments to your treatment plan to better manage your symptoms and improve your overall health.  YOUR PLAN: -LOW BACK PAIN WITH HERNIATED DISC: You have chronic low back pain due to osteoarthritis and a herniated disc, which causes persistent pain and affects your daily activities. We will start you on duloxetine to help manage the pain. If this medication is not effective, we may consider physical therapy, steroid injections, or surgery as a last resort.  -HYPOTHYROIDISM: Your thyroid  levels are not well-controlled, as indicated by your recent lab results. Continue taking levothyroxine  50 MCG daily, and we will monitor your thyroid  function tests regularly to ensure your levels improve.  -HYPERTENSION: Your blood pressure is well-controlled with your current medication, losartan -hydrochlorothiazide . Continue taking this medication daily as prescribed.  -HYPERLIPIDEMIA: Your LDL cholesterol is higher than the target level, which increases your risk for heart disease. We will start you on rosuvastatin  to help lower your cholesterol. Be aware of potential side effects like muscle aches, and we will monitor your cholesterol levels regularly.  -TYPE 2 DIABETES MELLITUS, WITHOUT COMPLICATIONS: Your recent A1c level indicates early-stage diabetes. While you do not need medication at this time, it is important to make lifestyle changes such as reducing sugar and carbohydrate intake and exercising regularly. We will recheck your A1c every 3-4 months to monitor your progress.  -OBESITY, CLASS II, BMI 35-39.9: Your weight is currently in the Class II obesity range. Focus on regular exercise and a healthy diet to  manage your weight. If lifestyle changes are not enough, we can discuss the potential use of medications like metformin  or Ozempic to aid in weight loss.  INSTRUCTIONS: Please follow up in 3 months to recheck your A1c and cholesterol levels. Continue taking your medications as prescribed and make the recommended lifestyle changes. If you experience any side effects or if your symptoms do not improve, contact our office.

## 2024-07-27 NOTE — Progress Notes (Signed)
 Established Patient Office Visit  Subjective   Patient ID: Stacey Mason, female    DOB: 1961-06-24  Age: 63 y.o. MRN: [id number]  Chief Complaint  Patient presents with   Hypertension    Does not check at home. Sometimes she will have HA    HPI  Discussed the use of AI scribe software for clinical note transcription with the patient, who gave verbal consent to proceed.  History of Present Illness Stacey Mason is a 63 year old female with hypertension and hypothyroidism who presents with persistent pain and follow-up on medication management.  She experiences persistent pain throughout her body, excluding her head. The pain is bothersome but not severe, accompanied by restlessness and body aches that impact her daily life. She feels 'lazy' at times and has altered her sleep schedule, staying up late and waking early, resulting in less than five hours of sleep per night. Despite having an exercise machine at home, she finds it challenging to find time for physical activity.  She is currently taking Hyzaar for hypertension and levothyroxine  for hypothyroidism. She was previously out of her thyroid  medication, and recent lab results showed a TSH of 15 and a T4 of 0.7. She does not notice any improvement in energy levels or temperature regulation since resuming levothyroxine . Her daughter notes that her energy levels are 'okay' but she still experiences body aches.  Recent blood work showed an LDL cholesterol of 145. She recalls possibly using cholesterol-lowering medication in the past but is unsure of the details.  Her blood sugar levels were noted to be elevated at 6.7%. She was previously prediabetic with a level of 6.1%. She has a fondness for sugar, particularly in tea. Her family history includes a father with diabetes, and her daughter has encouraged her to use sugar alternatives, which she finds unpalatable.  She has a history of a herniated disc, confirmed by MRI and X-rays in  2022, and reports worsening symptoms. She has previously tried physical therapy without success and experiences daily back pain, which is a significant concern for her.     Objective:     BP 120/69 (BP Location: Left Arm, Patient Position: Sitting, Cuff Size: Large)   Pulse (!) 101   Ht 5' 6 (1.676 m)   Wt 211 lb (95.7 kg)   BMI 34.06 kg/m   Physical Exam  Gen: Well-appearing woman Heart: Regular, 3 out of 6 early systolic murmur best heard at the right upper sternal border Lungs: Unlabored, clear throughout Ext: Warm, no edema MSK: Normal joints, no synovitis Neuro: Alert, conversational, full strength upper and lower extremities, slightly delayed get up and go, slow to get on the table, normal gait and balance    Assessment & Plan:    Problem List Items Addressed This Visit       High   HTN (hypertension) - Primary (Chronic)   Blood pressure is well-controlled with the current medication regimen.  Much improved compared to last visit.  Currently 120/69.  Continue losartan -hydrochlorothiazide  50-12.5 MG orally daily.  Check BMP today.      Relevant Medications   rosuvastatin  (CRESTOR ) 10 MG tablet   Other Relevant Orders   Basic metabolic panel with GFR   Microalbumin / creatinine urine ratio   Diabetes mellitus without complication (HCC) (Chronic)   A recent A1c of 6.7% indicates early-stage diabetes, with a history of prediabetes. There is no current need for medication, but lifestyle modifications are crucial. Recheck A1c every 3-4 months and encouraged dietary  changes to reduce sugar and carbohydrate intake, along with regular exercise.      Relevant Medications   rosuvastatin  (CRESTOR ) 10 MG tablet   Other Relevant Orders   Microalbumin / creatinine urine ratio     Medium    Hypothyroidism (Chronic)   TSH at 15 and T4 at 0.7, we restarted levothyroxine  50 mcg daily.  There has been some improvement in her fatigue.  Will recheck TSH today.      Relevant  Orders   TSH   Hyperlipidemia (Chronic)   The 10-year ASCVD risk score (Arnett DK, et al., 2019) is: 10.2%  LDL cholesterol is 145, above target, increasing cardiovascular risk. The benefits of statin therapy are discussed. Prescribed rosuvastatin  for cholesterol management and discuss potential side effects, including muscle aches. Monitor cholesterol levels regularly.  This is for primary prevention of an ischemic event.      Relevant Medications   rosuvastatin  (CRESTOR ) 10 MG tablet     Low   Low back pain (Chronic)   Chronic low back pain due to osteoarthritis and history of a herniated disc is confirmed by imaging in 2022, causing persistent pain that affects daily activities. Medication management is considered before invasive interventions. Prescribed duloxetine for pain management and discussed its potential side effects, including its non-opioid nature and benefits for mood and sleep. Consider physical therapy if medication is insufficient, steroid injections if physical therapy is ineffective, and surgical options as a last resort if other treatments fail.      Relevant Medications   DULoxetine (CYMBALTA) 30 MG capsule    Return in about 2 months (around 09/26/2024).    Cleatus Debby Specking, MD

## 2024-07-27 NOTE — Assessment & Plan Note (Signed)
 Chronic low back pain due to osteoarthritis and history of a herniated disc is confirmed by imaging in 2022, causing persistent pain that affects daily activities. Medication management is considered before invasive interventions. Prescribed duloxetine for pain management and discussed its potential side effects, including its non-opioid nature and benefits for mood and sleep. Consider physical therapy if medication is insufficient, steroid injections if physical therapy is ineffective, and surgical options as a last resort if other treatments fail.

## 2024-07-28 LAB — BASIC METABOLIC PANEL WITH GFR
BUN: 17 mg/dL (ref 6–23)
CO2: 24 meq/L (ref 19–32)
Calcium: 9.4 mg/dL (ref 8.4–10.5)
Chloride: 104 meq/L (ref 96–112)
Creatinine, Ser: 0.81 mg/dL (ref 0.40–1.20)
GFR: 78.33 mL/min (ref >60.00)
Glucose, Bld: 94 mg/dL (ref 70–99)
Potassium: 3.9 meq/L (ref 3.5–5.1)
Sodium: 139 meq/L (ref 135–145)

## 2024-07-28 LAB — MICROALBUMIN / CREATININE URINE RATIO
Creatinine,U: 178.8 mg/dL
Microalb Creat Ratio: 33.4 mg/g — ABNORMAL HIGH (ref 0.0–30.0)
Microalb, Ur: 6 mg/dL — ABNORMAL HIGH (ref 0.0–1.9)

## 2024-07-28 LAB — TSH: TSH: 7.03 u[IU]/mL — ABNORMAL HIGH (ref 0.35–5.50)

## 2024-07-29 ENCOUNTER — Ambulatory Visit: Payer: Self-pay | Admitting: Student in an Organized Health Care Education/Training Program

## 2024-09-27 ENCOUNTER — Ambulatory Visit: Admitting: Student in an Organized Health Care Education/Training Program

## 2024-09-27 ENCOUNTER — Encounter: Payer: Self-pay | Admitting: Student in an Organized Health Care Education/Training Program

## 2024-09-27 VITALS — BP 126/78 | HR 85 | Wt 206.0 lb

## 2024-09-27 DIAGNOSIS — I10 Essential (primary) hypertension: Secondary | ICD-10-CM | POA: Diagnosis not present

## 2024-09-27 DIAGNOSIS — G8929 Other chronic pain: Secondary | ICD-10-CM

## 2024-09-27 DIAGNOSIS — N182 Chronic kidney disease, stage 2 (mild): Secondary | ICD-10-CM | POA: Diagnosis not present

## 2024-09-27 DIAGNOSIS — E1122 Type 2 diabetes mellitus with diabetic chronic kidney disease: Secondary | ICD-10-CM

## 2024-09-27 DIAGNOSIS — M545 Low back pain, unspecified: Secondary | ICD-10-CM

## 2024-09-27 LAB — POCT GLYCOSYLATED HEMOGLOBIN (HGB A1C): Hemoglobin A1C: 5.9 % — AB (ref 4.0–5.6)

## 2024-09-27 NOTE — Patient Instructions (Signed)
°  VISIT SUMMARY: Today, we discussed your ongoing issues with diabetes, back pain, and other health concerns. You mentioned concerns about medication side effects and recent headaches. We reviewed your current medications and made some adjustments to your treatment plan.  YOUR PLAN: -LOW BACK PAIN DUE TO OSTEOARTHRITIS AND HISTORY OF A HERNIATED DISC: Your chronic low back pain, which worsens with standing and improves with rest, is due to osteoarthritis and a past herniated disc. We have started you on physical therapy focusing on core and leg strengthening exercises. If physical therapy does not help, we may consider an MRI and potential injections.  -TYPE 2 DIABETES MELLITUS: Your diabetes is well-controlled with your current lifestyle changes, including dietary modifications. Your Hemoglobin A1c has improved from 6.8% to 5.9%. Continue with your current diet and avoid soda and juice.  -PRIMARY HYPERTENSION: Your high blood pressure is well-controlled with your current medications. Your blood pressure today was 126/78 mmHg. Continue taking your blood pressure medications as prescribed.  -HYPOTHYROIDISM: Your low thyroid  hormone levels are managed with levothyroxine . We have resolved the recent issues with your prescription refills. Make sure to take your levothyroxine  as prescribed.  -HYPERLIPIDEMIA: Your high cholesterol is managed with rosuvastatin . Continue taking your statin medication as prescribed.  INSTRUCTIONS: Please continue with your physical therapy sessions and follow up with us  if your back pain does not improve. Ensure that you take all your medications as prescribed. If you experience any new symptoms or have concerns, contact our office. We will consider further imaging or treatments for your back pain if necessary.

## 2024-09-27 NOTE — Assessment & Plan Note (Signed)
 Diabetes is well-controlled with lifestyle modifications. Hemoglobin A1c improved from 6.8% to 5.9%. Continue current lifestyle modifications, including dietary changes and cessation of soda and juice consumption.  She has an elevated urine microalbumin, but no need for medication changes right now.

## 2024-09-27 NOTE — Assessment & Plan Note (Signed)
 Hypertension is well-controlled with the current medication regimen. Blood pressure today is 126/78 mmHg. Continue current antihypertensive medication regimen.

## 2024-09-27 NOTE — Progress Notes (Signed)
 Established Patient Office Visit  Patient ID: Stacey Mason, female    DOB: 1961/04/11  Age: 63 y.o. MRN: [id number] PCP: Jerrell Cleatus Ned, MD  Chief Complaint  Patient presents with   Hypertension    2 month follow up     Subjective:     HPI  Discussed the use of AI scribe software for clinical note transcription with the patient, who gave verbal consent to proceed.  History of Present Illness Stacey Mason is a 63 year old female with diabetes and back pain who presents for a follow-up on her medications.  She is concerned about the side effects of her diabetes medications but adheres to her regimen without missing doses. She has experienced a recent headache, for which she took Tylenol  and ibuprofen . She has lost three pounds over the past two months, attributing this to dietary changes, specifically switching from regular sugar to Equal. No depression or anxiety is reported, and she feels rested with five to six hours of sleep despite staying up late to watch news from her home country.  Her back pain is persistent, with occasional improvement but often remains severe. Resting alleviates the pain, while standing exacerbates it. She has discontinued duloxetine  due to adverse effects, describing it as making her feel horrible. The back pain impacts her ability to perform housework, necessitating assistance from her daughter.  She takes Synthroid  for her thyroid  and rosuvastatin  for cholesterol. She mentions a recent issue with medication refills, noting that she did not receive her Synthroid  and blood pressure medication from the pharmacy.  She has a history of a heart murmur, which was evaluated with an echocardiogram over five years ago. She stays informed about the situation in her home country due to ongoing conflict and maintains contact with her family there.     Objective:     BP 126/78   Pulse 85   Wt 206 lb (93.4 kg)   SpO2 99%   BMI 33.25 kg/m   Physical  Exam  Gen: Well-appearing woman Neck: Normal thyroid , no nodules Heart: Regular, 3 out of 6 early systolic murmur best heard at the right upper sternal border Lungs: Unlabored and clear Ext: Warm, no edema, normal joints   Results for orders placed or performed in visit on 09/27/24  POCT glycosylated hemoglobin (Hb A1C)  Result Value Ref Range   Hemoglobin A1C 5.9 (A) 4.0 - 5.6 %   HbA1c POC (<> result, manual entry)     HbA1c, POC (prediabetic range)     HbA1c, POC (controlled diabetic range)       Assessment & Plan:   Problem List Items Addressed This Visit       High   HTN (hypertension) (Chronic)   Hypertension is well-controlled with the current medication regimen. Blood pressure today is 126/78 mmHg. Continue current antihypertensive medication regimen.      Type 2 diabetes mellitus with diabetic chronic kidney disease (HCC) - Primary (Chronic)   Diabetes is well-controlled with lifestyle modifications. Hemoglobin A1c improved from 6.8% to 5.9%. Continue current lifestyle modifications, including dietary changes and cessation of soda and juice consumption.  She has an elevated urine microalbumin, but no need for medication changes right now.        Low   Low back pain (Chronic)   Chronic low back pain varies in severity, worsens with standing, and improves with rest.  We tried duloxetine  after last visit, however she had a sensation of nausea after starting that medication and discontinued  it quickly. Physical therapy focusing on core and leg strengthening exercises has been initiated.  Follow-up with me in 4 months.  If low back pain continues to be a chronic function limiting issue despite physical therapy, next up I would recommend is MRI imaging.  Hopefully we can improve her functional status, it at a point now where she is considering applying for disability.      Relevant Orders   Ambulatory referral to Physical Therapy    Return in about 4 months (around  01/26/2025) for Health maintenance.    Cleatus Debby Specking, MD [facility]

## 2024-09-27 NOTE — Assessment & Plan Note (Signed)
 Chronic low back pain varies in severity, worsens with standing, and improves with rest.  We tried duloxetine  after last visit, however she had a sensation of nausea after starting that medication and discontinued it quickly. Physical therapy focusing on core and leg strengthening exercises has been initiated.  Follow-up with me in 4 months.  If low back pain continues to be a chronic function limiting issue despite physical therapy, next up I would recommend is MRI imaging.  Hopefully we can improve her functional status, it at a point now where she is considering applying for disability.

## 2024-10-21 ENCOUNTER — Ambulatory Visit: Payer: Self-pay

## 2025-01-27 ENCOUNTER — Ambulatory Visit: Admitting: Student in an Organized Health Care Education/Training Program
